# Patient Record
Sex: Male | Born: 1945 | Race: White | Hispanic: No | Smoking: Never smoker
Health system: Southern US, Community
[De-identification: ages and names within clinical notes are randomized; demographics above are authoritative.]

## PROBLEM LIST (undated history)

## (undated) DIAGNOSIS — G2581 Restless legs syndrome: Secondary | ICD-10-CM

## (undated) DIAGNOSIS — F329 Major depressive disorder, single episode, unspecified: Secondary | ICD-10-CM

## (undated) DIAGNOSIS — K589 Irritable bowel syndrome without diarrhea: Secondary | ICD-10-CM

## (undated) DIAGNOSIS — Z87442 Personal history of urinary calculi: Secondary | ICD-10-CM

## (undated) DIAGNOSIS — J329 Chronic sinusitis, unspecified: Secondary | ICD-10-CM

## (undated) DIAGNOSIS — M1990 Unspecified osteoarthritis, unspecified site: Secondary | ICD-10-CM

## (undated) DIAGNOSIS — K5790 Diverticulosis of intestine, part unspecified, without perforation or abscess without bleeding: Secondary | ICD-10-CM

## (undated) DIAGNOSIS — F419 Anxiety disorder, unspecified: Secondary | ICD-10-CM

## (undated) DIAGNOSIS — I499 Cardiac arrhythmia, unspecified: Secondary | ICD-10-CM

## (undated) DIAGNOSIS — J309 Allergic rhinitis, unspecified: Secondary | ICD-10-CM

## (undated) DIAGNOSIS — I10 Essential (primary) hypertension: Secondary | ICD-10-CM

## (undated) DIAGNOSIS — F32A Depression, unspecified: Secondary | ICD-10-CM

## (undated) DIAGNOSIS — Z950 Presence of cardiac pacemaker: Secondary | ICD-10-CM

## (undated) DIAGNOSIS — J189 Pneumonia, unspecified organism: Secondary | ICD-10-CM

## (undated) DIAGNOSIS — I2510 Atherosclerotic heart disease of native coronary artery without angina pectoris: Secondary | ICD-10-CM

## (undated) DIAGNOSIS — D649 Anemia, unspecified: Secondary | ICD-10-CM

## (undated) DIAGNOSIS — K219 Gastro-esophageal reflux disease without esophagitis: Secondary | ICD-10-CM

## (undated) HISTORY — PX: KNEE ARTHROSCOPY: SUR90

## (undated) HISTORY — PX: COLONOSCOPY: SHX174

## (undated) HISTORY — PX: OTHER SURGICAL HISTORY: SHX169

## (undated) HISTORY — PX: BACK SURGERY: SHX140

---

## 1984-12-09 DIAGNOSIS — Z87442 Personal history of urinary calculi: Secondary | ICD-10-CM

## 1984-12-09 HISTORY — DX: Personal history of urinary calculi: Z87.442

## 2005-04-05 ENCOUNTER — Ambulatory Visit: Payer: Self-pay | Admitting: Unknown Physician Specialty

## 2005-04-11 ENCOUNTER — Ambulatory Visit: Payer: Self-pay | Admitting: Unknown Physician Specialty

## 2007-06-03 ENCOUNTER — Ambulatory Visit: Payer: Self-pay | Admitting: Internal Medicine

## 2008-07-25 ENCOUNTER — Other Ambulatory Visit: Payer: Self-pay

## 2008-07-25 ENCOUNTER — Emergency Department: Payer: Self-pay | Admitting: Emergency Medicine

## 2008-08-10 ENCOUNTER — Ambulatory Visit: Payer: Self-pay | Admitting: Cardiology

## 2014-01-26 ENCOUNTER — Ambulatory Visit: Payer: Self-pay | Admitting: Physical Medicine and Rehabilitation

## 2014-11-16 ENCOUNTER — Ambulatory Visit: Payer: Self-pay | Admitting: Pain Medicine

## 2014-11-28 ENCOUNTER — Ambulatory Visit: Payer: Self-pay | Admitting: Pain Medicine

## 2014-12-27 ENCOUNTER — Ambulatory Visit: Payer: Self-pay | Admitting: Pain Medicine

## 2015-01-16 ENCOUNTER — Ambulatory Visit: Payer: Self-pay | Admitting: Pain Medicine

## 2015-04-21 ENCOUNTER — Ambulatory Visit
Admission: RE | Admit: 2015-04-21 | Discharge: 2015-04-21 | Disposition: A | Discharge status: Home / Self Care | Payer: Medicare PPO | Source: Ambulatory Visit | Attending: Unknown Physician Specialty | Admitting: Unknown Physician Specialty

## 2015-04-21 ENCOUNTER — Encounter
Admission: RE | Disposition: A | Discharge status: Home / Self Care | Payer: Self-pay | Source: Ambulatory Visit | Attending: Unknown Physician Specialty

## 2015-04-21 ENCOUNTER — Ambulatory Visit: Payer: Medicare PPO | Admitting: Anesthesiology

## 2015-04-21 DIAGNOSIS — Z791 Long term (current) use of non-steroidal anti-inflammatories (NSAID): Secondary | ICD-10-CM | POA: Insufficient documentation

## 2015-04-21 DIAGNOSIS — K219 Gastro-esophageal reflux disease without esophagitis: Secondary | ICD-10-CM | POA: Insufficient documentation

## 2015-04-21 DIAGNOSIS — F419 Anxiety disorder, unspecified: Secondary | ICD-10-CM | POA: Diagnosis not present

## 2015-04-21 DIAGNOSIS — I2510 Atherosclerotic heart disease of native coronary artery without angina pectoris: Secondary | ICD-10-CM | POA: Diagnosis not present

## 2015-04-21 DIAGNOSIS — D120 Benign neoplasm of cecum: Secondary | ICD-10-CM | POA: Diagnosis not present

## 2015-04-21 DIAGNOSIS — Z9889 Other specified postprocedural states: Secondary | ICD-10-CM | POA: Diagnosis not present

## 2015-04-21 DIAGNOSIS — Z7951 Long term (current) use of inhaled steroids: Secondary | ICD-10-CM | POA: Insufficient documentation

## 2015-04-21 DIAGNOSIS — J309 Allergic rhinitis, unspecified: Secondary | ICD-10-CM | POA: Diagnosis not present

## 2015-04-21 DIAGNOSIS — D128 Benign neoplasm of rectum: Secondary | ICD-10-CM | POA: Insufficient documentation

## 2015-04-21 DIAGNOSIS — K5730 Diverticulosis of large intestine without perforation or abscess without bleeding: Secondary | ICD-10-CM | POA: Diagnosis not present

## 2015-04-21 DIAGNOSIS — I10 Essential (primary) hypertension: Secondary | ICD-10-CM | POA: Diagnosis not present

## 2015-04-21 DIAGNOSIS — F329 Major depressive disorder, single episode, unspecified: Secondary | ICD-10-CM | POA: Insufficient documentation

## 2015-04-21 DIAGNOSIS — D122 Benign neoplasm of ascending colon: Secondary | ICD-10-CM | POA: Diagnosis not present

## 2015-04-21 DIAGNOSIS — K640 First degree hemorrhoids: Secondary | ICD-10-CM | POA: Insufficient documentation

## 2015-04-21 DIAGNOSIS — Z1211 Encounter for screening for malignant neoplasm of colon: Secondary | ICD-10-CM | POA: Diagnosis not present

## 2015-04-21 DIAGNOSIS — Z7982 Long term (current) use of aspirin: Secondary | ICD-10-CM | POA: Diagnosis not present

## 2015-04-21 DIAGNOSIS — M1990 Unspecified osteoarthritis, unspecified site: Secondary | ICD-10-CM | POA: Insufficient documentation

## 2015-04-21 DIAGNOSIS — Z79899 Other long term (current) drug therapy: Secondary | ICD-10-CM | POA: Diagnosis not present

## 2015-04-21 HISTORY — DX: Restless legs syndrome: G25.81

## 2015-04-21 HISTORY — DX: Unspecified osteoarthritis, unspecified site: M19.90

## 2015-04-21 HISTORY — DX: Hypercalcemia: E83.52

## 2015-04-21 HISTORY — DX: Major depressive disorder, single episode, unspecified: F32.9

## 2015-04-21 HISTORY — DX: Gastro-esophageal reflux disease without esophagitis: K21.9

## 2015-04-21 HISTORY — PX: COLONOSCOPY: SHX5424

## 2015-04-21 HISTORY — DX: Allergic rhinitis, unspecified: J30.9

## 2015-04-21 HISTORY — DX: Depression, unspecified: F32.A

## 2015-04-21 HISTORY — DX: Anxiety disorder, unspecified: F41.9

## 2015-04-21 HISTORY — DX: Anemia, unspecified: D64.9

## 2015-04-21 HISTORY — DX: Atherosclerotic heart disease of native coronary artery without angina pectoris: I25.10

## 2015-04-21 HISTORY — DX: Chronic sinusitis, unspecified: J32.9

## 2015-04-21 HISTORY — DX: Essential (primary) hypertension: I10

## 2015-04-21 SURGERY — COLONOSCOPY
Anesthesia: General

## 2015-04-21 MED ORDER — EPHEDRINE SULFATE 50 MG/ML IJ SOLN
INTRAMUSCULAR | Status: DC | PRN
  Administered 2015-04-21: 10 mg via INTRAVENOUS

## 2015-04-21 MED ORDER — GLYCOPYRROLATE 0.2 MG/ML IJ SOLN
INTRAMUSCULAR | Status: DC | PRN
  Administered 2015-04-21: 0.2 mg via INTRAVENOUS

## 2015-04-21 MED ORDER — PROPOFOL INFUSION 10 MG/ML OPTIME
INTRAVENOUS | Status: DC | PRN
  Administered 2015-04-21: 140 ug/kg/min via INTRAVENOUS

## 2015-04-21 MED ORDER — FENTANYL CITRATE (PF) 100 MCG/2ML IJ SOLN: 25.0000 ug | INTRAMUSCULAR | Status: DC | PRN

## 2015-04-21 MED ORDER — SODIUM CHLORIDE 0.9 % IV SOLN
INTRAVENOUS | Status: DC
  Administered 2015-04-21: 1000 mL via INTRAVENOUS

## 2015-04-21 MED ORDER — MIDAZOLAM HCL 2 MG/2ML IJ SOLN
INTRAMUSCULAR | Status: DC | PRN
  Administered 2015-04-21: 1 mg via INTRAVENOUS

## 2015-04-21 MED ORDER — PROPOFOL 10 MG/ML IV BOLUS
INTRAVENOUS | Status: DC | PRN
  Administered 2015-04-21: 30 mg via INTRAVENOUS
  Administered 2015-04-21: 40 mg via INTRAVENOUS

## 2015-04-21 MED ORDER — GLYCOPYRROLATE 0.2 MG/ML IJ SOLN: INTRAMUSCULAR | Status: DC | PRN

## 2015-04-21 MED ORDER — ONDANSETRON HCL 4 MG/2ML IJ SOLN: 4.0000 mg | Freq: Once | INTRAMUSCULAR | Status: DC | PRN

## 2015-04-21 MED ORDER — FENTANYL CITRATE (PF) 100 MCG/2ML IJ SOLN
INTRAMUSCULAR | Status: DC | PRN
  Administered 2015-04-21: 50 ug via INTRAVENOUS

## 2015-04-21 MED ORDER — SODIUM CHLORIDE 0.9 % IV SOLN: INTRAVENOUS | Status: DC

## 2015-04-21 NOTE — Anesthesia Postprocedure Evaluation (Signed)
  Anesthesia Post-op Note  Patient: Jesus Blevins  Procedure(s) Performed: Procedure(s): COLONOSCOPY (N/A)  Anesthesia type:General  Patient location: PACU  Post pain: Pain level controlled  Post assessment: Post-op Vital signs reviewed, Patient's Cardiovascular Status Stable, Respiratory Function Stable, Patent Airway and No signs of Nausea or vomiting  Post vital signs: Reviewed and stable  Last Vitals:  Filed Vitals:   04/21/15 0900  BP: 97/72  Pulse: 71  Temp: 36 C  Resp: 19    Level of consciousness: awake, alert  and patient cooperative  Complications: No apparent anesthesia complications

## 2015-04-21 NOTE — Transfer of Care (Signed)
Immediate Anesthesia Transfer of Care Note  Patient: Jesus Blevins  Procedure(s) Performed: Procedure(s): COLONOSCOPY (N/A)  Patient Location: PACU  Anesthesia Type:MAC  Level of Consciousness: awake, alert  and patient cooperative  Airway & Oxygen Therapy: Patient Spontanous Breathing and Patient connected to nasal cannula oxygen  Post-op Assessment: Report given to RN  Post vital signs: Reviewed and stable  Last Vitals:  Filed Vitals:   04/21/15 0724  BP: 141/86  Pulse: 52  Temp: 35.9 C  Resp: 17    Complications: No apparent anesthesia complications

## 2015-04-21 NOTE — Op Note (Signed)
[facility] Gastroenterology Patient Name: Jesus Blevins Procedure Date: 04/21/2015 8:01 AM MRN: [id number] Account #: [id number] Date of Birth: 12-11-1945 Admit Type: Outpatient Age: 69 Room: [facility] ENDO ROOM 1 Gender: Male Note Status: Finalized Procedure:         Colonoscopy Indications:       Screening for colorectal malignant neoplasm Providers:         Manya Silvas, MD Referring MD:      Ramonita Lab, MD (Referring MD) Medicines:         Propofol per Anesthesia Complications:     No immediate complications. Procedure:         Pre-Anesthesia Assessment:                    - After reviewing the risks and benefits, the patient was                     deemed in satisfactory condition to undergo the procedure.                    After obtaining informed consent, the colonoscope was                     passed under direct vision. Throughout the procedure, the                     patient's blood pressure, pulse, and oxygen saturations                     were monitored continuously. The Olympus PCF-H180AL                     colonoscope ( S#: [id number] ) was introduced through the                     anus and advanced to the the cecum, identified by                     appendiceal orifice and ileocecal valve. The colonoscopy                     was performed without difficulty. The patient tolerated                     the procedure well. The quality of the bowel preparation                     was excellent. Findings:      A 6 mm polyp was found in the rectum. The polyp was sessile. The polyp       was removed with a hot snare. Resection and retrieval were complete.      Three sessile polyps were found in the ascending colon. The polyps were       diminutive in size. These polyps were removed with a jumbo cold forceps.       Resection and retrieval were complete.      Many small-mouthed diverticula were found in the sigmoid colon.      Internal hemorrhoids were  found during endoscopy. The hemorrhoids were       small and Grade I (internal hemorrhoids that do not prolapse). Impression:        - One 6 mm polyp in the rectum. Resected and retrieved.                    -  Three diminutive polyps in the ascending colon. Resected                     and retrieved.                    - Diverticulosis in the sigmoid colon.                    - Internal hemorrhoids. Recommendation:    - Await pathology results. Manya Silvas, MD 04/21/2015 8:44:43 AM This report has been signed electronically. Number of Addenda: 0 Note Initiated On: 04/21/2015 8:01 AM Scope Withdrawal Time: 0 hours 13 minutes 12 seconds  Total Procedure Duration: 0 hours 28 minutes 6 seconds       [facility]

## 2015-04-21 NOTE — H&P (Signed)
Primary Care Physician:  Tama High III Primary Gastroenterologist:  Dr. Vira Agar  Pre-Procedure History & Physical: HPI:  Jesus Blevins is a 69 y.o. male is here for a screening colonoscopy.   Past Medical History  Diagnosis Date  . Hypertension   . GERD (gastroesophageal reflux disease)   . Allergic rhinitis   . Restless leg   . C. difficile colitis   . Hypercalcemia   . Sinusitis   . Depression   . Anxiety   . Osteoarthritis   . Anemia   . Coronary artery disease     Past Surgical History  Procedure Laterality Date  . Sinus septum and polyp surgery    . Thumb trigger release Left   . Tongue polyp excision    . Knee arthoscopy    . Spine surgery l3-l5    . Colonoscopy    . Lumb disectomy cervicle/lumbar multiple levels      Prior to Admission medications   Medication Sig Start Date End Date Taking? Authorizing Provider  ALPRAZolam Duanne Moron) 0.5 MG tablet Take 0.5 mg by mouth 2 (two) times daily as needed for anxiety.   Yes Historical Provider, MD  aspirin 81 MG tablet Take 81 mg by mouth daily.   Yes Historical Provider, MD  atorvastatin (LIPITOR) 40 MG tablet Take 40 mg by mouth daily.   Yes Historical Provider, MD  azelastine (ASTELIN) 0.1 % nasal spray Place 1 spray into both nostrils 2 (two) times daily. Use in each nostril as directed   Yes Historical Provider, MD  calcium-vitamin D (OSCAL WITH D) 500-200 MG-UNIT per tablet Take 1 tablet by mouth once.   Yes Historical Provider, MD  fexofenadine (ALLEGRA) 180 MG tablet Take 180 mg by mouth daily.   Yes Historical Provider, MD  fluticasone (FLONASE) 50 MCG/ACT nasal spray Place 1 spray into both nostrils daily.   Yes Historical Provider, MD  glucosamine-chondroitin 500-400 MG tablet Take 2 tablets by mouth once.   Yes Historical Provider, MD  losartan (COZAAR) 50 MG tablet Take 50 mg by mouth daily.   Yes Historical Provider, MD  montelukast (SINGULAIR) 10 MG tablet Take 10 mg by mouth at bedtime.   Yes  Historical Provider, MD  MULTI GINSENG 1000 MG CAPS Take 1,000 mg by mouth once.   Yes Historical Provider, MD  Multiple Vitamin (MULTIVITAMIN) capsule Take 1 capsule by mouth daily.   Yes Historical Provider, MD  naproxen sodium (ANAPROX) 220 MG tablet Take 440 mg by mouth 2 (two) times daily with a meal.   Yes Historical Provider, MD  omega-3 acid ethyl esters (LOVAZA) 1 G capsule Take 2 capsules by mouth once.   Yes Historical Provider, MD  omeprazole (PRILOSEC) 40 MG capsule Take 20 mg by mouth daily.   Yes Historical Provider, MD  polyethylene glycol (MIRALAX / GLYCOLAX) packet Take 17 g by mouth daily.   Yes Historical Provider, MD  senna (SENOKOT) 8.6 MG tablet Take 1 tablet by mouth 2 (two) times daily.   Yes Historical Provider, MD  sertraline (ZOLOFT) 50 MG tablet Take 50 mg by mouth daily.   Yes Historical Provider, MD    Allergies as of 03/31/2015  . (Not on File)     Review of Systems: See HPI, otherwise negative ROS  Physical Exam: BP 141/86 mmHg  Pulse 52  Temp(Src) 96.7 F (35.9 C) (Tympanic)  Resp 17  Ht 5\' 11"  (1.803 m)  Wt 95.255 kg (210 lb)  BMI 29.30 kg/m2  SpO2 99% General:   Alert,  pleasant and cooperative in NAD Head:  Normocephalic and atraumatic. Neck:  Supple; no masses or thyromegaly. Lungs:  Clear throughout to auscultation.    Heart:  Regular rate and rhythm. Abdomen:  Soft, nontender and nondistended. Normal bowel sounds, without guarding, and without rebound.   Neurologic:  Alert and  oriented x4;  grossly normal neurologically.  Impression/Plan: Jesus Blevins is now here to undergo a screening colonoscopy.  Risks, benefits, and alternatives regarding colonoscopy have been reviewed with the patient.  Questions have been answered.  All parties agreeable.   Gaylyn Cheers , MD

## 2015-04-21 NOTE — Anesthesia Procedure Notes (Signed)
Performed by: Allean Found Pre-anesthesia Checklist: Patient identified, Emergency Drugs available, Suction available and Patient being monitored Oxygen Delivery Method: Nasal cannula Intubation Type: IV induction Placement Confirmation: positive ETCO2

## 2015-04-21 NOTE — Anesthesia Preprocedure Evaluation (Signed)
Anesthesia Evaluation  Patient identified by MRN, date of birth, ID band Patient awake    Reviewed: Allergy & Precautions, NPO status , Patient's Chart, lab work & pertinent test results, reviewed documented beta blocker date and time   Airway Mallampati: II  TM Distance: >3 FB Neck ROM: Full    Dental  (+) Chipped, Caps   Pulmonary neg pulmonary ROS,  breath sounds clear to auscultation  Pulmonary exam normal       Cardiovascular hypertension, + CAD Rhythm:Regular Rate:Normal     Neuro/Psych Anxiety Depression negative neurological ROS     GI/Hepatic Neg liver ROS, GERD-  ,  Endo/Other  negative endocrine ROS  Renal/GU negative Renal ROS  negative genitourinary   Musculoskeletal  (+) Arthritis -, Osteoarthritis,    Abdominal Normal abdominal exam  (+)   Peds negative pediatric ROS (+)  Hematology negative hematology ROS (+)   Anesthesia Other Findings   Reproductive/Obstetrics                             Anesthesia Physical Anesthesia Plan  ASA: III  Anesthesia Plan: General   Post-op Pain Management:    Induction: Intravenous  Airway Management Planned: Nasal Cannula  Additional Equipment:   Intra-op Plan:   Post-operative Plan:   Informed Consent: I have reviewed the patients History and Physical, chart, labs and discussed the procedure including the risks, benefits and alternatives for the proposed anesthesia with the patient or authorized representative who has indicated his/her understanding and acceptance.   Dental advisory given  Plan Discussed with: CRNA and Surgeon  Anesthesia Plan Comments:         Anesthesia Quick Evaluation

## 2015-04-24 ENCOUNTER — Encounter: Payer: Self-pay | Admitting: Unknown Physician Specialty

## 2015-04-24 LAB — SURGICAL PATHOLOGY

## 2015-12-10 HISTORY — PX: JOINT REPLACEMENT: SHX530

## 2016-03-26 ENCOUNTER — Other Ambulatory Visit: Payer: Self-pay | Admitting: Orthopedic Surgery

## 2016-03-26 ENCOUNTER — Encounter
Admission: RE | Admit: 2016-03-26 | Discharge: 2016-03-26 | Disposition: A | Discharge status: Home / Self Care | Payer: Medicare Other | Source: Ambulatory Visit | Attending: Orthopedic Surgery | Admitting: Orthopedic Surgery

## 2016-03-26 DIAGNOSIS — Z01810 Encounter for preprocedural cardiovascular examination: Secondary | ICD-10-CM | POA: Insufficient documentation

## 2016-03-26 LAB — URINALYSIS COMPLETE WITH MICROSCOPIC (ARMC ONLY)
Bacteria, UA: NONE SEEN
Bilirubin Urine: NEGATIVE
GLUCOSE, UA: NEGATIVE mg/dL
HGB URINE DIPSTICK: NEGATIVE
KETONES UR: NEGATIVE mg/dL
Leukocytes, UA: NEGATIVE
NITRITE: NEGATIVE
PROTEIN: NEGATIVE mg/dL
RBC / HPF: NONE SEEN RBC/hpf (ref 0–5)
SPECIFIC GRAVITY, URINE: 1.015 (ref 1.005–1.030)
Squamous Epithelial / LPF: NONE SEEN
pH: 7 (ref 5.0–8.0)

## 2016-03-26 LAB — BASIC METABOLIC PANEL
ANION GAP: 7 (ref 5–15)
BUN: 14 mg/dL (ref 6–20)
CO2: 26 mmol/L (ref 22–32)
Calcium: 9.5 mg/dL (ref 8.9–10.3)
Chloride: 105 mmol/L (ref 101–111)
Creatinine, Ser: 0.85 mg/dL (ref 0.61–1.24)
GFR calc Af Amer: >60 mL/min (ref >60)
GFR calc non Af Amer: >60 mL/min (ref >60)
Glucose, Bld: 94 mg/dL (ref 65–99)
POTASSIUM: 4.2 mmol/L (ref 3.5–5.1)
SODIUM: 138 mmol/L (ref 135–145)

## 2016-03-26 LAB — CBC WITH DIFFERENTIAL/PLATELET
BASOS ABS: 0 10*3/uL (ref 0–0.1)
Basophils Relative: 0 %
Eosinophils Absolute: 0.2 10*3/uL (ref 0–0.7)
Eosinophils Relative: 2 %
HEMATOCRIT: 43.1 % (ref 40.0–52.0)
HEMOGLOBIN: 14.7 g/dL (ref 13.0–18.0)
LYMPHS ABS: 3.5 10*3/uL (ref 1.0–3.6)
LYMPHS PCT: 36 %
MCH: 30.6 pg (ref 26.0–34.0)
MCHC: 34.2 g/dL (ref 32.0–36.0)
MCV: 89.4 fL (ref 80.0–100.0)
Monocytes Absolute: 0.7 10*3/uL (ref 0.2–1.0)
Monocytes Relative: 8 %
NEUTROS ABS: 5.4 10*3/uL (ref 1.4–6.5)
Neutrophils Relative %: 54 %
Platelets: 201 10*3/uL (ref 150–440)
RBC: 4.82 MIL/uL (ref 4.40–5.90)
RDW: 13.2 % (ref 11.5–14.5)
WBC: 9.8 10*3/uL (ref 3.8–10.6)

## 2016-03-26 LAB — TYPE AND SCREEN
ABO/RH(D): O POS
Antibody Screen: NEGATIVE

## 2016-03-26 LAB — PROTIME-INR
INR: 1.01
Prothrombin Time: 13.5 seconds (ref 11.4–15.0)

## 2016-03-26 LAB — APTT: aPTT: 31 seconds (ref 24–36)

## 2016-03-26 LAB — SURGICAL PCR SCREEN
MRSA, PCR: NEGATIVE
STAPHYLOCOCCUS AUREUS: NEGATIVE

## 2016-03-26 NOTE — Patient Instructions (Addendum)
Your procedure is scheduled on: Tuesday 04/02/16 Report to Day Surgery. 2ND FLOOR MEDICAL MALL ENTRANCE To find out your arrival time please call [phone] between 1PM - 3PM on Monday 04/01/16.  Remember: Instructions that are not followed completely may result in serious medical risk, up to and including death, or upon the discretion of your surgeon and anesthesiologist your surgery may need to be rescheduled.    __X__ 1. Do not eat food or drink liquids after midnight. No gum chewing or hard candies.     __X__ 2. No Alcohol for 24 hours before or after surgery.   ____ 3. Bring all medications with you on the day of surgery if instructed.    __X__ 4. Notify your doctor if there is any change in your medical condition     (cold, fever, infections).     Do not wear jewelry, make-up, hairpins, clips or nail polish.  Do not wear lotions, powders, or perfumes.   Do not shave 48 hours prior to surgery. Men may shave face and neck.  Do not bring valuables to the hospital.    [facility] is not responsible for any belongings or valuables.               Contacts, dentures or bridgework may not be worn into surgery.  Leave your suitcase in the car. After surgery it may be brought to your room.  For patients admitted to the hospital, discharge time is determined by your                treatment team.   Patients discharged the day of surgery will not be allowed to drive home.   Please read over the following fact sheets that you were given:   MRSA Information and Surgical Site Infection Prevention   __X__ Take these medicines the morning of surgery with A SIP OF WATER:    1. FEXOFENADINE  2. LOSARTAN  3. OMEPRAZOLE  4.   5.  6.  ____ Fleet Enema (as directed)   __X__ Use CHG Soap as directed  ____ Use inhalers on the day of surgery  ____ Stop metformin 2 days prior to surgery    ____ Take 1/2 of usual insulin dose the night before surgery and none on the morning of surgery.    __X__ Stop Coumadin/Plavix/aspirin on ALREADY STOPPED 4/15  __X__ Stop Anti-inflammatories on ALREADY STOPPED 4/15 (NAPROXEN)   __X__ Stop supplements until after surgery. (GLUCOSAMINE, GINSENG, FISH OIL) ALREADY STOPPED 4/15   ____ Bring C-Pap to the hospital.

## 2016-03-27 LAB — ABO/RH: ABO/RH(D): O POS

## 2016-03-28 NOTE — Pre-Procedure Instructions (Signed)
Pre-op Clearance on chart

## 2016-04-02 ENCOUNTER — Encounter: Admission: RE | Payer: Self-pay | Source: Ambulatory Visit

## 2016-04-02 ENCOUNTER — Inpatient Hospital Stay: Admission: RE | Admit: 2016-04-02 | Payer: Medicare Other | Source: Ambulatory Visit | Admitting: Orthopedic Surgery

## 2016-04-02 SURGERY — ARTHROPLASTY, KNEE, TOTAL
Anesthesia: Choice | Laterality: Left

## 2016-04-16 ENCOUNTER — Inpatient Hospital Stay: Payer: Medicare Other | Admitting: Anesthesiology

## 2016-04-16 ENCOUNTER — Inpatient Hospital Stay
Admission: RE | Admit: 2016-04-16 | Discharge: 2016-04-18 | DRG: 470 | Disposition: A | Discharge status: Home / Self Care | Payer: Medicare Other | Source: Ambulatory Visit | Attending: Orthopedic Surgery | Admitting: Orthopedic Surgery

## 2016-04-16 ENCOUNTER — Encounter
Admission: RE | Disposition: A | Discharge status: Home / Self Care | Payer: Self-pay | Source: Ambulatory Visit | Attending: Orthopedic Surgery

## 2016-04-16 ENCOUNTER — Inpatient Hospital Stay: Payer: Medicare Other

## 2016-04-16 DIAGNOSIS — Z882 Allergy status to sulfonamides status: Secondary | ICD-10-CM

## 2016-04-16 DIAGNOSIS — Z7951 Long term (current) use of inhaled steroids: Secondary | ICD-10-CM

## 2016-04-16 DIAGNOSIS — Z833 Family history of diabetes mellitus: Secondary | ICD-10-CM | POA: Diagnosis not present

## 2016-04-16 DIAGNOSIS — Z96652 Presence of left artificial knee joint: Secondary | ICD-10-CM

## 2016-04-16 DIAGNOSIS — Z79899 Other long term (current) drug therapy: Secondary | ICD-10-CM

## 2016-04-16 DIAGNOSIS — I2510 Atherosclerotic heart disease of native coronary artery without angina pectoris: Secondary | ICD-10-CM | POA: Diagnosis present

## 2016-04-16 DIAGNOSIS — Z823 Family history of stroke: Secondary | ICD-10-CM | POA: Diagnosis not present

## 2016-04-16 DIAGNOSIS — Z9889 Other specified postprocedural states: Secondary | ICD-10-CM

## 2016-04-16 DIAGNOSIS — I10 Essential (primary) hypertension: Secondary | ICD-10-CM | POA: Diagnosis present

## 2016-04-16 DIAGNOSIS — Z8249 Family history of ischemic heart disease and other diseases of the circulatory system: Secondary | ICD-10-CM | POA: Diagnosis not present

## 2016-04-16 DIAGNOSIS — I959 Hypotension, unspecified: Secondary | ICD-10-CM | POA: Diagnosis present

## 2016-04-16 DIAGNOSIS — K219 Gastro-esophageal reflux disease without esophagitis: Secondary | ICD-10-CM | POA: Diagnosis present

## 2016-04-16 DIAGNOSIS — Z809 Family history of malignant neoplasm, unspecified: Secondary | ICD-10-CM

## 2016-04-16 DIAGNOSIS — Z8261 Family history of arthritis: Secondary | ICD-10-CM | POA: Diagnosis not present

## 2016-04-16 DIAGNOSIS — M1712 Unilateral primary osteoarthritis, left knee: Secondary | ICD-10-CM | POA: Diagnosis present

## 2016-04-16 DIAGNOSIS — R0981 Nasal congestion: Secondary | ICD-10-CM | POA: Diagnosis present

## 2016-04-16 HISTORY — PX: TOTAL KNEE ARTHROPLASTY: SHX125

## 2016-04-16 LAB — CBC
HCT: 38.8 % — ABNORMAL LOW (ref 40.0–52.0)
HEMOGLOBIN: 13.2 g/dL (ref 13.0–18.0)
MCH: 30.2 pg (ref 26.0–34.0)
MCHC: 34 g/dL (ref 32.0–36.0)
MCV: 88.8 fL (ref 80.0–100.0)
Platelets: 172 10*3/uL (ref 150–440)
RBC: 4.37 MIL/uL — ABNORMAL LOW (ref 4.40–5.90)
RDW: 12.7 % (ref 11.5–14.5)
WBC: 9.1 10*3/uL (ref 3.8–10.6)

## 2016-04-16 LAB — CREATININE, SERUM
CREATININE: 0.78 mg/dL (ref 0.61–1.24)
GFR calc Af Amer: >60 mL/min (ref >60)

## 2016-04-16 LAB — TYPE AND SCREEN
ABO/RH(D): O POS
Antibody Screen: NEGATIVE

## 2016-04-16 SURGERY — ARTHROPLASTY, KNEE, TOTAL
Anesthesia: Spinal | Laterality: Left | Wound class: Clean

## 2016-04-16 MED ORDER — PANTOPRAZOLE SODIUM 40 MG PO TBEC
40.0000 mg | DELAYED_RELEASE_TABLET | Freq: Every day | ORAL | Status: DC
  Administered 2016-04-17 – 2016-04-18 (×2): 40 mg via ORAL
  Filled 2016-04-16 (×4): qty 1

## 2016-04-16 MED ORDER — TRANEXAMIC ACID 1000 MG/10ML IV SOLN
1000.0000 mg | Freq: Once | INTRAVENOUS | Status: AC
  Administered 2016-04-16: 1000 mg via INTRAVENOUS
  Filled 2016-04-16: qty 10

## 2016-04-16 MED ORDER — ASPIRIN 81 MG PO TABS: 81.0000 mg | ORAL_TABLET | Freq: Every day | ORAL | Status: DC

## 2016-04-16 MED ORDER — CEFAZOLIN SODIUM-DEXTROSE 2-4 GM/100ML-% IV SOLN
INTRAVENOUS | Status: AC
  Filled 2016-04-16: qty 100

## 2016-04-16 MED ORDER — FLUTICASONE PROPIONATE 50 MCG/ACT NA SUSP
1.0000 | Freq: Every day | NASAL | Status: DC
  Administered 2016-04-17 – 2016-04-18 (×2): 1 via NASAL
  Filled 2016-04-16: qty 16

## 2016-04-16 MED ORDER — ALPRAZOLAM 0.5 MG PO TABS
0.5000 mg | ORAL_TABLET | Freq: Two times a day (BID) | ORAL | Status: DC | PRN
  Filled 2016-04-16: qty 1

## 2016-04-16 MED ORDER — METOCLOPRAMIDE HCL 5 MG/ML IJ SOLN: 5.0000 mg | Freq: Three times a day (TID) | INTRAMUSCULAR | Status: DC | PRN

## 2016-04-16 MED ORDER — ACETAMINOPHEN 325 MG PO TABS: 650.0000 mg | ORAL_TABLET | Freq: Four times a day (QID) | ORAL | Status: DC | PRN

## 2016-04-16 MED ORDER — METHOCARBAMOL 500 MG PO TABS
500.0000 mg | ORAL_TABLET | Freq: Four times a day (QID) | ORAL | Status: DC | PRN
  Administered 2016-04-16: 500 mg via ORAL
  Filled 2016-04-16: qty 1

## 2016-04-16 MED ORDER — KETOROLAC TROMETHAMINE 15 MG/ML IJ SOLN
15.0000 mg | Freq: Four times a day (QID) | INTRAMUSCULAR | Status: AC | PRN
  Administered 2016-04-16 – 2016-04-17 (×2): 15 mg via INTRAVENOUS
  Filled 2016-04-16 (×2): qty 1

## 2016-04-16 MED ORDER — PROPOFOL 500 MG/50ML IV EMUL
INTRAVENOUS | Status: DC | PRN
  Administered 2016-04-16: 50 ug/kg/min via INTRAVENOUS

## 2016-04-16 MED ORDER — BUPIVACAINE HCL (PF) 0.25 % IJ SOLN
INTRAMUSCULAR | Status: AC
  Filled 2016-04-16: qty 30

## 2016-04-16 MED ORDER — ENOXAPARIN SODIUM 30 MG/0.3ML ~~LOC~~ SOLN
30.0000 mg | Freq: Two times a day (BID) | SUBCUTANEOUS | Status: DC
  Administered 2016-04-17 – 2016-04-18 (×3): 30 mg via SUBCUTANEOUS
  Filled 2016-04-16 (×3): qty 0.3

## 2016-04-16 MED ORDER — BISACODYL 10 MG RE SUPP: 10.0000 mg | Freq: Every day | RECTAL | Status: DC | PRN

## 2016-04-16 MED ORDER — POLYETHYLENE GLYCOL 3350 17 G PO PACK: 17.0000 g | PACK | Freq: Every day | ORAL | Status: DC | PRN

## 2016-04-16 MED ORDER — SODIUM CHLORIDE 0.9 % IV SOLN
Freq: Once | INTRAVENOUS | Status: AC
  Administered 2016-04-16: 15:00:00 via INTRAVENOUS

## 2016-04-16 MED ORDER — CALCIUM CARBONATE-VITAMIN D 500-200 MG-UNIT PO TABS
1.0000 | ORAL_TABLET | Freq: Once | ORAL | Status: DC
  Filled 2016-04-16: qty 1

## 2016-04-16 MED ORDER — GLYCOPYRROLATE 0.2 MG/ML IJ SOLN
INTRAMUSCULAR | Status: DC | PRN
  Administered 2016-04-16: 0.2 mg via INTRAVENOUS

## 2016-04-16 MED ORDER — ASPIRIN EC 81 MG PO TBEC
81.0000 mg | DELAYED_RELEASE_TABLET | Freq: Every day | ORAL | Status: DC
  Administered 2016-04-17 – 2016-04-18 (×2): 81 mg via ORAL
  Filled 2016-04-16 (×4): qty 1

## 2016-04-16 MED ORDER — CHLORHEXIDINE GLUCONATE 4 % EX LIQD: 1.0000 "application " | Freq: Once | CUTANEOUS | Status: DC

## 2016-04-16 MED ORDER — BUPIVACAINE LIPOSOME 1.3 % IJ SUSP
INTRAMUSCULAR | Status: AC
  Filled 2016-04-16: qty 20

## 2016-04-16 MED ORDER — CEFAZOLIN SODIUM-DEXTROSE 2-4 GM/100ML-% IV SOLN
2.0000 g | INTRAVENOUS | Status: AC
  Administered 2016-04-16: 2 g via INTRAVENOUS

## 2016-04-16 MED ORDER — MENTHOL 3 MG MT LOZG
1.0000 | LOZENGE | OROMUCOSAL | Status: DC | PRN
  Filled 2016-04-16: qty 9

## 2016-04-16 MED ORDER — SODIUM CHLORIDE 0.9 % IV BOLUS (SEPSIS)
500.0000 mL | Freq: Once | INTRAVENOUS | Status: AC
  Administered 2016-04-16: 500 mL via INTRAVENOUS

## 2016-04-16 MED ORDER — ONDANSETRON HCL 4 MG/2ML IJ SOLN: 4.0000 mg | Freq: Four times a day (QID) | INTRAMUSCULAR | Status: DC | PRN

## 2016-04-16 MED ORDER — KETOROLAC TROMETHAMINE 15 MG/ML IJ SOLN: 15.0000 mg | INTRAMUSCULAR | Status: DC

## 2016-04-16 MED ORDER — KETAMINE HCL 10 MG/ML IJ SOLN
INTRAMUSCULAR | Status: DC | PRN
  Administered 2016-04-16: 50 mg via INTRAVENOUS

## 2016-04-16 MED ORDER — LACTATED RINGERS IV SOLN
INTRAVENOUS | Status: DC
  Administered 2016-04-16 (×2): via INTRAVENOUS

## 2016-04-16 MED ORDER — FENTANYL CITRATE (PF) 100 MCG/2ML IJ SOLN
INTRAMUSCULAR | Status: DC | PRN
  Administered 2016-04-16 (×2): 50 ug via INTRAVENOUS

## 2016-04-16 MED ORDER — BUPIVACAINE HCL 0.25 % IJ SOLN
INTRAMUSCULAR | Status: DC | PRN
  Administered 2016-04-16: 30 mL

## 2016-04-16 MED ORDER — LIDOCAINE HCL (PF) 2 % IJ SOLN
INTRAMUSCULAR | Status: DC | PRN
  Administered 2016-04-16: 50 mg

## 2016-04-16 MED ORDER — ZOLPIDEM TARTRATE 5 MG PO TABS: 5.0000 mg | ORAL_TABLET | Freq: Every evening | ORAL | Status: DC | PRN

## 2016-04-16 MED ORDER — METHOCARBAMOL 1000 MG/10ML IJ SOLN
500.0000 mg | Freq: Four times a day (QID) | INTRAVENOUS | Status: DC | PRN
  Filled 2016-04-16: qty 5

## 2016-04-16 MED ORDER — DOCUSATE SODIUM 100 MG PO CAPS
100.0000 mg | ORAL_CAPSULE | Freq: Two times a day (BID) | ORAL | Status: DC
  Administered 2016-04-16 – 2016-04-18 (×4): 100 mg via ORAL
  Filled 2016-04-16 (×5): qty 1

## 2016-04-16 MED ORDER — ACETAMINOPHEN 650 MG RE SUPP: 650.0000 mg | Freq: Four times a day (QID) | RECTAL | Status: DC | PRN

## 2016-04-16 MED ORDER — OXYCODONE HCL 5 MG PO TABS
10.0000 mg | ORAL_TABLET | ORAL | Status: DC | PRN
  Administered 2016-04-16 – 2016-04-18 (×10): 10 mg via ORAL
  Filled 2016-04-16 (×9): qty 2
  Filled 2016-04-16: qty 1
  Filled 2016-04-16: qty 2

## 2016-04-16 MED ORDER — MAGNESIUM HYDROXIDE 400 MG/5ML PO SUSP
30.0000 mL | Freq: Every day | ORAL | Status: DC | PRN
  Administered 2016-04-17: 30 mL via ORAL
  Filled 2016-04-16: qty 30

## 2016-04-16 MED ORDER — NEOMYCIN-POLYMYXIN B GU 40-200000 IR SOLN
Status: AC
Start: 2016-04-16 — End: 2016-04-16
  Filled 2016-04-16: qty 20

## 2016-04-16 MED ORDER — AZELASTINE HCL 0.1 % NA SOLN
1.0000 | Freq: Two times a day (BID) | NASAL | Status: DC
  Administered 2016-04-16 – 2016-04-17 (×2): 1 via NASAL
  Filled 2016-04-16: qty 30

## 2016-04-16 MED ORDER — BUPIVACAINE HCL (PF) 0.5 % IJ SOLN
INTRAMUSCULAR | Status: DC | PRN
  Administered 2016-04-16: 3 mL via INTRATHECAL

## 2016-04-16 MED ORDER — ATORVASTATIN CALCIUM 20 MG PO TABS
40.0000 mg | ORAL_TABLET | Freq: Every day | ORAL | Status: DC
  Administered 2016-04-16 – 2016-04-17 (×2): 40 mg via ORAL
  Filled 2016-04-16 (×2): qty 2

## 2016-04-16 MED ORDER — HYDROMORPHONE HCL 1 MG/ML IJ SOLN
1.0000 mg | INTRAMUSCULAR | Status: DC | PRN
  Administered 2016-04-16 (×2): 1 mg via INTRAVENOUS
  Filled 2016-04-16 (×3): qty 1

## 2016-04-16 MED ORDER — MULTIVITAMINS PO CAPS
1.0000 | ORAL_CAPSULE | Freq: Every day | ORAL | Status: DC
Start: 2016-04-16 — End: 2016-04-16

## 2016-04-16 MED ORDER — CEFAZOLIN SODIUM-DEXTROSE 2-4 GM/100ML-% IV SOLN
2.0000 g | Freq: Four times a day (QID) | INTRAVENOUS | Status: AC
  Administered 2016-04-16 (×2): 2 g via INTRAVENOUS
  Filled 2016-04-16 (×2): qty 100

## 2016-04-16 MED ORDER — SODIUM CHLORIDE 0.9 % IJ SOLN
INTRAMUSCULAR | Status: AC
  Filled 2016-04-16: qty 100

## 2016-04-16 MED ORDER — SERTRALINE HCL 50 MG PO TABS
50.0000 mg | ORAL_TABLET | Freq: Every day | ORAL | Status: DC
  Administered 2016-04-16 – 2016-04-17 (×2): 50 mg via ORAL
  Filled 2016-04-16 (×2): qty 1

## 2016-04-16 MED ORDER — SODIUM CHLORIDE 0.9 % IV SOLN
INTRAVENOUS | Status: DC | PRN
  Administered 2016-04-16: 20 mL

## 2016-04-16 MED ORDER — ONDANSETRON HCL 4 MG PO TABS
4.0000 mg | ORAL_TABLET | Freq: Four times a day (QID) | ORAL | Status: DC | PRN
  Administered 2016-04-16: 4 mg via ORAL
  Filled 2016-04-16: qty 1

## 2016-04-16 MED ORDER — FENTANYL CITRATE (PF) 100 MCG/2ML IJ SOLN
INTRAMUSCULAR | Status: AC
  Filled 2016-04-16: qty 2

## 2016-04-16 MED ORDER — ONDANSETRON HCL 4 MG/2ML IJ SOLN: 4.0000 mg | Freq: Once | INTRAMUSCULAR | Status: DC | PRN

## 2016-04-16 MED ORDER — LORATADINE 10 MG PO TABS
10.0000 mg | ORAL_TABLET | Freq: Every day | ORAL | Status: DC
  Administered 2016-04-17 – 2016-04-18 (×2): 10 mg via ORAL
  Filled 2016-04-16 (×3): qty 1

## 2016-04-16 MED ORDER — OXYCODONE HCL 5 MG PO TABS
10.0000 mg | ORAL_TABLET | ORAL | Status: AC
  Administered 2016-04-16: 10 mg via ORAL
  Filled 2016-04-16: qty 2

## 2016-04-16 MED ORDER — MIDAZOLAM HCL 5 MG/5ML IJ SOLN
INTRAMUSCULAR | Status: DC | PRN
  Administered 2016-04-16: 2 mg via INTRAVENOUS

## 2016-04-16 MED ORDER — SODIUM CHLORIDE 0.9 % IV SOLN
INTRAVENOUS | Status: DC
Start: 2016-04-16 — End: 2016-04-18
  Administered 2016-04-16 (×2): via INTRAVENOUS

## 2016-04-16 MED ORDER — MONTELUKAST SODIUM 10 MG PO TABS
10.0000 mg | ORAL_TABLET | Freq: Every day | ORAL | Status: DC
  Administered 2016-04-16 – 2016-04-17 (×2): 10 mg via ORAL
  Filled 2016-04-16 (×2): qty 1

## 2016-04-16 MED ORDER — FENTANYL CITRATE (PF) 100 MCG/2ML IJ SOLN
25.0000 ug | INTRAMUSCULAR | Status: DC | PRN
  Administered 2016-04-16 (×2): 25 ug via INTRAVENOUS

## 2016-04-16 MED ORDER — ADULT MULTIVITAMIN W/MINERALS CH
1.0000 | ORAL_TABLET | Freq: Every day | ORAL | Status: DC
  Administered 2016-04-17 – 2016-04-18 (×2): 1 via ORAL
  Filled 2016-04-16 (×3): qty 1

## 2016-04-16 MED ORDER — NEOMYCIN-POLYMYXIN B GU 40-200000 IR SOLN
Status: DC | PRN
  Administered 2016-04-16: 16 mL

## 2016-04-16 MED ORDER — METOCLOPRAMIDE HCL 10 MG PO TABS
5.0000 mg | ORAL_TABLET | Freq: Three times a day (TID) | ORAL | Status: DC | PRN
  Administered 2016-04-16: 10 mg via ORAL
  Filled 2016-04-16: qty 1

## 2016-04-16 MED ORDER — LOSARTAN POTASSIUM 50 MG PO TABS
50.0000 mg | ORAL_TABLET | Freq: Every day | ORAL | Status: DC
  Administered 2016-04-17 – 2016-04-18 (×2): 50 mg via ORAL
  Filled 2016-04-16 (×3): qty 1

## 2016-04-16 MED ORDER — ALUM & MAG HYDROXIDE-SIMETH 200-200-20 MG/5ML PO SUSP
30.0000 mL | ORAL | Status: DC | PRN
  Administered 2016-04-16 – 2016-04-17 (×2): 30 mL via ORAL
  Filled 2016-04-16 (×3): qty 30

## 2016-04-16 MED ORDER — PHENOL 1.4 % MT LIQD
1.0000 | OROMUCOSAL | Status: DC | PRN
  Filled 2016-04-16: qty 177

## 2016-04-16 MED ORDER — DIPHENHYDRAMINE HCL 12.5 MG/5ML PO ELIX: 12.5000 mg | ORAL_SOLUTION | ORAL | Status: DC | PRN

## 2016-04-16 SURGICAL SUPPLY — 64 items
AUTOTRANSFUS HAS 1/8 (MISCELLANEOUS) ×3
BAG DECANTER FOR FLEXI CONT (MISCELLANEOUS) IMPLANT
BLADE DEBAKEY 8.0 (BLADE) ×2 IMPLANT
BLADE DEBAKEY 8.0MM (BLADE) ×1
BLADE SAW 1 (BLADE) ×3 IMPLANT
BLADE SAW 1/2 (BLADE) ×3 IMPLANT
BLADE SURG 15 STRL LF DISP TIS (BLADE) ×1 IMPLANT
BLADE SURG 15 STRL SS (BLADE) ×2
CANISTER SUCT 1200ML W/VALVE (MISCELLANEOUS) ×6 IMPLANT
CAP KNEE TOTAL 3 SIGMA ×3 IMPLANT
CATH TRAY METER 16FR LF (MISCELLANEOUS) ×3 IMPLANT
CEMENT HV SMART SET (Cement) ×6 IMPLANT
CNTNR SPEC 2.5X3XGRAD LEK (MISCELLANEOUS) ×1
CONT SPEC 4OZ STER OR WHT (MISCELLANEOUS) ×2
CONTAINER SPEC 2.5X3XGRAD LEK (MISCELLANEOUS) ×1 IMPLANT
COOLER POLAR GLACIER W/PUMP (MISCELLANEOUS) ×3 IMPLANT
CUFF TOURN 24 STER (MISCELLANEOUS) IMPLANT
CUFF TOURN 30 STER DUAL PORT (MISCELLANEOUS) IMPLANT
DRAPE IMP U-DRAPE 54X76 (DRAPES) ×3 IMPLANT
DRAPE INCISE IOBAN 66X60 STRL (DRAPES) ×3 IMPLANT
DRAPE SHEET LG 3/4 BI-LAMINATE (DRAPES) ×3 IMPLANT
DRAPE SURG 17X11 SM STRL (DRAPES) ×6 IMPLANT
DRSG OPSITE POSTOP 4X12 (GAUZE/BANDAGES/DRESSINGS) ×3 IMPLANT
DRSG OPSITE POSTOP 4X14 (GAUZE/BANDAGES/DRESSINGS) IMPLANT
DURAPREP 26ML APPLICATOR (WOUND CARE) ×9 IMPLANT
ELECT REM PT RETURN 9FT ADLT (ELECTROSURGICAL) ×3
ELECTRODE REM PT RTRN 9FT ADLT (ELECTROSURGICAL) ×1 IMPLANT
GAUZE PETRO XEROFOAM 1X8 (MISCELLANEOUS) ×3 IMPLANT
GAUZE SPONGE 4X4 12PLY STRL (GAUZE/BANDAGES/DRESSINGS) ×3 IMPLANT
GLOVE BIOGEL PI IND STRL 9 (GLOVE) ×1 IMPLANT
GLOVE BIOGEL PI INDICATOR 9 (GLOVE) ×2
GLOVE SURG 9.0 ORTHO LTXF (GLOVE) ×9 IMPLANT
GOWN STRL REUS TWL 2XL XL LVL4 (GOWN DISPOSABLE) ×3 IMPLANT
GOWN STRL REUS W/ TWL LRG LVL3 (GOWN DISPOSABLE) ×4 IMPLANT
GOWN STRL REUS W/TWL LRG LVL3 (GOWN DISPOSABLE) ×8
GOWN STRL REUS W/TWL XL LVL4 (GOWN DISPOSABLE) ×3 IMPLANT
HANDPIECE SUCTION TUBG SURGILV (MISCELLANEOUS) ×3 IMPLANT
IMMBOLIZER KNEE 19 BLUE UNIV (SOFTGOODS) ×3 IMPLANT
IV NS 100ML SINGLE PACK (IV SOLUTION) ×3 IMPLANT
KIT RM TURNOVER STRD PROC AR (KITS) ×3 IMPLANT
NDL SAFETY 18GX1.5 (NEEDLE) ×3 IMPLANT
NDL SAFETY 22GX1.5 (NEEDLE) ×3 IMPLANT
NEEDLE SPNL 20GX3.5 QUINCKE YW (NEEDLE) ×3 IMPLANT
NS IRRIG 1000ML POUR BTL (IV SOLUTION) ×3 IMPLANT
PACK TOTAL KNEE (MISCELLANEOUS) ×3 IMPLANT
PAD PREP 24X41 OB/GYN DISP (PERSONAL CARE ITEMS) ×3 IMPLANT
PAD WRAPON POLAR KNEE (MISCELLANEOUS) ×1 IMPLANT
SOL .9 NS 3000ML IRR  AL (IV SOLUTION) ×2
SOL .9 NS 3000ML IRR UROMATIC (IV SOLUTION) ×1 IMPLANT
SPONGE DRAIN TRACH 4X4 STRL 2S (GAUZE/BANDAGES/DRESSINGS) ×3 IMPLANT
SPONGE LAP 18X18 5 PK (GAUZE/BANDAGES/DRESSINGS) ×3 IMPLANT
STAPLER SKIN PROX 35W (STAPLE) ×3 IMPLANT
SUCTION FRAZIER HANDLE 10FR (MISCELLANEOUS) ×2
SUCTION TUBE FRAZIER 10FR DISP (MISCELLANEOUS) ×1 IMPLANT
SUT ETHIBOND NAB CT1 #1 30IN (SUTURE) ×6 IMPLANT
SUT MNCRL AB 3-0 PS2 27 (SUTURE) ×6 IMPLANT
SUT VIC AB 0 CT1 36 (SUTURE) ×3 IMPLANT
SUT VIC AB 2-0 CT1 (SUTURE) ×6 IMPLANT
SYR 20CC LL (SYRINGE) ×3 IMPLANT
SYR 50ML LL SCALE MARK (SYRINGE) ×6 IMPLANT
SYSTEM AUTOTRANSFUS DUAL TROCR (MISCELLANEOUS) ×1 IMPLANT
TOWER CARTRIDGE SMART MIX (DISPOSABLE) ×3 IMPLANT
TUBE SUCT KAM VAC (TUBING) ×3 IMPLANT
WRAPON POLAR PAD KNEE (MISCELLANEOUS) ×3

## 2016-04-16 NOTE — Op Note (Signed)
DATE OF SURGERY:  04/16/2016 TIME: 12:46 PM  PATIENT NAME:  Jesus Blevins   AGE: 70 y.o.    PRE-OPERATIVE DIAGNOSIS:  Unilateral primary osteoarthritis, left knee  POST-OPERATIVE DIAGNOSIS:  Same  PROCEDURE:  Procedure(s): LEFT TOTAL KNEE ARTHROPLASTY  SURGEON:  Thornton Park, MD   ASSISTANT:  Surgical tech  OPERATIVE IMPLANTS: Depuy PFC Sigma, Posterior Stabilized Femural component size 4, Tibia size rotating platform component size 5, Patella polyethylene 3-peg oval button size 41, with a 12.5 mm polyethylene insert.   PREOPERATIVE INDICATIONS:  Jesus Blevins is an 70 y.o. male who has a diagnosis of left knee osteoarthritis and elected for a left total knee arthroplasty after failing nonoperative treatment, including hyaluronic acid and corticosteroid injections who has significant impairment of his activities of daily living.  Radiographs have demonstrated tricompartmental osteoarthritis joint space narrowing, osteophytes, subchondral sclerosis and cyst formation.  The risks, benefits, and alternatives were discussed at length including but not limited to the risks of infection, bleeding, nerve or blood vessel injury, knee stiffness, fracture, dislocation, loosening or failure of the hardware and the need for further surgery. Medical risks include but not limited to DVT and pulmonary embolism, myocardial infarction, stroke, pneumonia, respiratory failure and death. I discussed these risks with the patient in my office prior to the date of surgery. They understood these risks and were willing to proceed.  OPERATIVE FINDINGS AND UNIQUE ASPECTS OF THE CASE:  Tricompartmental osteoarthritis   OPERATIVE DESCRIPTION:  The patient was brought to the operative room and placed in a supine position after undergoing placement of a spinal anesthetic.  A Foley catheter was placed.  IV antibiotics were given. Patient received 2 grams of ancef. The lower extremity was prepped and draped in the  usual sterile fashion.  A time out was performed to verify the patient's name, date of birth, medical record number, correct site of surgery and correct procedure to be performed. The timeout was also used to confirm the patient received antibiotics and that appropriate instruments, implants and radiographs studies were available in the room.  The leg was elevated and exsanguinated with an Esmarch and the tourniquet was inflated to 275 mmHg for 130 minutes..  A midline incision was made over the left knee. Full-thickness skin flaps were developed. A medial parapatellar arthrotomy was then made and the patella everted and the knee was brought into 90 of flexion. Hoffa's fat pad along with the cruciate ligaments and medial and lateral menisci were resected.   The distal femoral intramedullary canal was opened with a drill and the intramedullary distal femoral cutting jig was inserted into the femoral canal pinned into position. It was set at 5 degrees resecting 10 mm off the distal femur.  Care was taken to protect the collateral ligaments during distal femoral resection.  The distal femoral resection was performed with an oscillating saw. The femoral cutting guide was then removed.  The extramedullary tibial cutting guide was then placed using the anterior tibial crest and second ray of the foot as a references.  The tibial cutting guide was adjusted to allow for appropriate posterior slope.  The tibial cutting block was pinned into position. The slotted stylus was used to measure the proximal tibial resection of 10 mm off the high lateral side.  The tibial long rod alignment guide was then used to confirm position of the cutting block. A third cross pin through the tibial cutting block was then drilled into position to allow for rotational stability. Care  was taken during the tibial resection to protect the medial and collateral ligaments.  The resected tibial bone was removed along with the posterior horns  of the menisci.  The PCL was sacrificed.  Extension gap was measured with a spacer block and alignment and extension was confirmed using a long alignment rod.  The attention was then turned back to the femur. The posterior referencing distal femoral sizing guide was applied to the distal femur.  The femur was sized to be a 4. Rotation of the referencing guide was checked with the epicondylar axis and Whitesides line. Then the 4-in-1 cutting jig was then applied to the distal femur. A stylus was used to confirm that the anterior femur would not be notched.   Then the anterior, posterior and chamfer femoral cuts were then made with an oscillating saw.  All posterior osteophytes were removed.  The flexion gap was then measured with a flexion spacer block and long alignment rod and was found to be symmetric with the extension gap and perpendicular to mechanical axis of the tibia.  The distal femoral preparation was completed by performing the posterior stabilized box cut using the cutting block. The entry site for the intramedullary femoral guide was filled with autologous bone graft from bone previously resected earlier in the case.  The proximal tibia plateau was then sized with trial trays. The best coverage was achieved with a size 5. This tibial tray was then pinned into position. The proximal tibia was then prepared with the reamer and keel punch.  After tibial preparation was completed, all trial components were inserted including a polyethylene insert.  The knee was found to have excellent balance and full motion with a size 12.5 mm tibial polyethylene insert..    The attention was then turned to preparation of the patella. The thickness of the patella was measured with a caliper, the diameter measured with the patella templates.  The patella resection was then made with an oscillating saw using the patella cutting guide.  The final patellar thickness 15 mm.  3 peg holes for the patella component were  then drilled. The trial patella was then placed. Knee was taken through a full range of motion and deemed to be stable with the trial components. All trial components were then removed. The knee capsule was then injected with Exparel. The joint was copiously irrigated with pulse lavage.  The final total knee arthroplasty components were then cemented into place and all excess methylmethacrylate was removed.  The joint was again copiously irrigated. After the cement had hardened the knee was again taken through a full range of motion. It was felt to be most stable with the 12.5 mm tibial polyethylene insert. The actual tibial polyethylene insert was then placed.   The knee was taken through a range of motion and the patella tracked well and the knee was again irrigated copiously.    An Autovac drain was placed. The 2 drain limbs were brought out through the superior lateral knee. The medial arthrotomy was closed with #1 Ethibond. The subcutaneous tissue closed with 0 and 2-0 vicryl, and skin approximated with staples.  A dry sterile and compressive dressing was applied.  A Polar Care was applied to the operative knee along with TENS unit pads and a knee immobilizer.  The patient was awakened and brought to the PACU in stable and satisfactory condition.  All sharp, lap and instrument counts were correct at the conclusion the case. I spoke with the patient's family  in the postop consultation room to let them know the case was performed without complication and the patient was stable in recovery room.   Total tourniquet time was 130 minutes.

## 2016-04-16 NOTE — OR Nursing (Signed)
autovac activated at 1159.

## 2016-04-16 NOTE — Transfer of Care (Signed)
Immediate Anesthesia Transfer of Care Note  Patient: Jesus Blevins  Procedure(s) Performed: Procedure(s): TOTAL KNEE ARTHROPLASTY (Left)  Patient Location: PACU  Anesthesia Type:Spinal  Level of Consciousness: awake  Airway & Oxygen Therapy: Patient Spontanous Breathing and Patient connected to face mask oxygen  Post-op Assessment: Report given to RN  Post vital signs: Reviewed  Last Vitals:  Filed Vitals:   04/16/16 0718 04/16/16 1213  BP: 133/82 88/64  Pulse: 56 54  Temp: 36.2 C 36.2 C  Resp: 16 18    Last Pain:  Filed Vitals:   04/16/16 1216  PainSc: 3       Patients Stated Pain Goal: 0 ([id number])  Complications: No apparent anesthesia complications

## 2016-04-16 NOTE — Progress Notes (Signed)
BP 86/56 patient experiencing a lot of pain. Dr. Mack Guise notified. 500cc bolus ordered.

## 2016-04-16 NOTE — Progress Notes (Signed)
PT Cancellation Note  Patient Details Name: Jesus Blevins MRN: [id number] DOB: Apr 05, 1946   Cancelled Treatment:    Reason Eval/Treat Not Completed: Other (comment).  Attempted PT eval but pt c/o L foot still being numb (LE sensation tested and full sensation not returned post-op yet in L foot) and pt also c/o 10/10 pain in L knee even with pain meds (nursing notified).  Will re-attempt PT eval tomorrow.   Raquel Sarna Avynn Klassen 04/16/2016, 4:29 PM Leitha Bleak, [place]

## 2016-04-16 NOTE — Anesthesia Procedure Notes (Addendum)
Spinal Patient location during procedure: OR Staffing Anesthesiologist: KEPHART, WILLIAM K Resident/CRNA: Devere Brem Performed by: resident/CRNA  Preanesthetic Checklist Completed: patient identified, site marked, surgical consent, pre-op evaluation, timeout performed, IV checked, risks and benefits discussed and monitors and equipment checked Spinal Block Patient position: sitting Prep: Betadine and site prepped and draped Patient monitoring: heart rate, continuous pulse ox, blood pressure and cardiac monitor Approach: midline Location: L3-4 Injection technique: single-shot Needle Needle type: Whitacre and Introducer  Needle gauge: 24 G Needle length: 9 cm Additional Notes Negative paresthesia. Negative blood return. Positive free-flowing CSF. Expiration date of kit checked and confirmed. Patient tolerated procedure well, without complications.     

## 2016-04-16 NOTE — NC FL2 (Signed)
[place] LEVEL OF CARE SCREENING TOOL     IDENTIFICATION  Patient Name: Jesus Blevins Birthdate: May 23, 1946 Sex: male Admission Date (Current Location): 04/16/2016  [place] and [place] Number:  Engineering geologist and Address:  [facility], [address] [place], [place] [address]      Provider Number: [id number]  Attending Physician Name and Address:  Thornton Park, MD  Relative Name and Phone Number:       Current Level of Care: Hospital Recommended Level of Care: [place] Prior Approval Number:    Date Approved/Denied:   PASRR Number:  ([id number])  Discharge Plan: SNF    Current Diagnoses: Patient Active Problem List   Diagnosis Date Noted  . Status post total left knee replacement 04/16/2016   Acute left otitis media 01/29/2016  Essential hypertension 01/16/2016  Hyperlipidemia, unspecified 01/16/2016  Recurrent major depressive disorder, in partial remission (CMS-HCC) 01/16/2016  Constipation, unspecified constipation type 03/16/2015  Hip pain 06/24/2014  Status post lumbar spine operation 06/03/2014  Lumbar radiculopathy 04/26/2014  Dysphagia, unspecified 03/21/2014  Carpal tunnel syndrome 03/21/2014  Vitamin D deficiency disease, unspecified 03/21/2014  Anemia, unspecified 03/21/2014  CAD (coronary artery disease)      Orientation RESPIRATION BLADDER Height & Weight     Self, Time, Situation, Place  Normal Incontinent, Indwelling catheter Weight: 209 lb 12.8 oz (95.165 kg) Height:  5\' 11"  (180.3 cm)  BEHAVIORAL SYMPTOMS/MOOD NEUROLOGICAL BOWEL NUTRITION STATUS   (none )  (none ) Continent Diet (Diet: Clear Liquid )  AMBULATORY STATUS COMMUNICATION OF NEEDS Skin   Extensive Assist Verbally Surgical wounds (Incision: Left Knee )                       Personal Care Assistance Level of Assistance  Bathing, Feeding, Dressing Bathing Assistance: Limited assistance Feeding assistance:  Independent Dressing Assistance: Limited assistance     Functional Limitations Info  Sight, Hearing, Speech Sight Info: Adequate Hearing Info: Adequate Speech Info: Adequate    SPECIAL CARE FACTORS FREQUENCY  PT (By licensed PT), OT (By licensed OT)     PT Frequency:  (5) OT Frequency:  (5)            Contractures      Additional Factors Info  Code Status, Allergies Code Status Info:  (Not on file ) Allergies Info:  (Sulfa Antibiotics, Elavil)           Current Medications (04/16/2016):  This is the current hospital active medication list Current Facility-Administered Medications  Medication Dose Route Frequency Provider Last Rate Last Dose  . 0.9 %  sodium chloride infusion   Intravenous Continuous Thornton Park, MD      . acetaminophen (TYLENOL) tablet 650 mg  650 mg Oral Q6H PRN Thornton Park, MD       Or  . acetaminophen (TYLENOL) suppository 650 mg  650 mg Rectal Q6H PRN Thornton Park, MD      . ALPRAZolam Duanne Moron) tablet 0.5 mg  0.5 mg Oral BID PRN Thornton Park, MD      . alum & mag hydroxide-simeth (MAALOX/MYLANTA) 200-200-20 MG/5ML suspension 30 mL  30 mL Oral Q4H PRN Thornton Park, MD      . aspirin EC tablet 81 mg  81 mg Oral Daily Loleta Dicker, RPH      . atorvastatin (LIPITOR) tablet 40 mg  40 mg Oral QHS Thornton Park, MD      . azelastine (ASTELIN) 0.1 % nasal  spray 1 spray  1 spray Each Nare BID Thornton Park, MD      . bisacodyl (DULCOLAX) suppository 10 mg  10 mg Rectal Daily PRN Thornton Park, MD      . calcium-vitamin D (OSCAL WITH D) 500-200 MG-UNIT per tablet 1 tablet  1 tablet Oral Once Thornton Park, MD      . ceFAZolin (ANCEF) 2-4 GM/100ML-% IVPB           . ceFAZolin (ANCEF) IVPB 2g/100 mL premix  2 g Intravenous Q6H Thornton Park, MD      . diphenhydrAMINE (BENADRYL) 12.5 MG/5ML elixir 12.5-25 mg  12.5-25 mg Oral Q4H PRN Thornton Park, MD      . docusate sodium (COLACE) capsule 100 mg  100 mg Oral BID Thornton Park, MD      . Derrill Memo ON 04/17/2016] enoxaparin (LOVENOX) injection 30 mg  30 mg Subcutaneous Q12H Thornton Park, MD      . fentaNYL (SUBLIMAZE) 100 MCG/2ML injection           . fluticasone (FLONASE) 50 MCG/ACT nasal spray 1 spray  1 spray Each Nare Daily Thornton Park, MD      . HYDROmorphone (DILAUDID) injection 1 mg  1 mg Intravenous Q2H PRN Thornton Park, MD      . loratadine (CLARITIN) tablet 10 mg  10 mg Oral Daily Thornton Park, MD      . losartan (COZAAR) tablet 50 mg  50 mg Oral Daily Thornton Park, MD      . magnesium hydroxide (MILK OF MAGNESIA) suspension 30 mL  30 mL Oral Daily PRN Thornton Park, MD      . menthol-cetylpyridinium (CEPACOL) lozenge 3 mg  1 lozenge Oral PRN Thornton Park, MD       Or  . phenol (CHLORASEPTIC) mouth spray 1 spray  1 spray Mouth/Throat PRN Thornton Park, MD      . methocarbamol (ROBAXIN) tablet 500 mg  500 mg Oral Q6H PRN Thornton Park, MD       Or  . methocarbamol (ROBAXIN) 500 mg in dextrose 5 % 50 mL IVPB  500 mg Intravenous Q6H PRN Thornton Park, MD      . metoCLOPramide (REGLAN) tablet 5-10 mg  5-10 mg Oral Q8H PRN Thornton Park, MD       Or  . metoCLOPramide (REGLAN) injection 5-10 mg  5-10 mg Intravenous Q8H PRN Thornton Park, MD      . montelukast (SINGULAIR) tablet 10 mg  10 mg Oral QHS Thornton Park, MD      . multivitamin with minerals tablet 1 tablet  1 tablet Oral Daily Crystal G Scarpena, RPH      . ondansetron (ZOFRAN) tablet 4 mg  4 mg Oral Q6H PRN Thornton Park, MD       Or  . ondansetron [facility]) injection 4 mg  4 mg Intravenous Q6H PRN Thornton Park, MD      . oxyCODONE (Oxy IR/ROXICODONE) immediate release tablet 10-15 mg  10-15 mg Oral Q3H PRN Thornton Park, MD      . pantoprazole (PROTONIX) EC tablet 40 mg  40 mg Oral Daily Thornton Park, MD      . polyethylene glycol (MIRALAX / GLYCOLAX) packet 17 g  17 g Oral Daily PRN Thornton Park, MD      . sertraline (ZOLOFT) tablet 50 mg  50 mg  Oral QHS Thornton Park, MD      . zolpidem [facility]) tablet 5 mg  5 mg Oral QHS PRN Thornton Park,  MD         Discharge Medications: Please see discharge summary for a list of discharge medications.  Relevant Imaging Results:  Relevant Lab Results:   Additional Information  (SSN: [id number])  Loralyn Freshwater, LCSW

## 2016-04-16 NOTE — Progress Notes (Signed)
Notified Dr. Ronelle Nigh of heart rate range from 47-63.  No further orders and not a problem he said.  All other vitals Stable and patient alert.  Denies any pain or distress.

## 2016-04-16 NOTE — H&P (Signed)
The patient has been re-examined, and the chart reviewed, and there have been no interval changes to the documented history and physical.  Patient is seen in pre-op with his wife and pastor.    I re-examined the patient in pre-op this AM.     HEENT:  PERRLA, EOMI, No oral lesions.  His sinuses are clear.  No symptoms of infection.  He has been treated with antibiotics 2 weeks ago by Dr. Tami Ribas.  CARDIAC:  RRR, No M/R/G  CHEST:  CTA bilaterally  ABDOMEN:  Soft, NT, ND, +BS  The risks, benefits, and alternatives have been discussed at length, and the patient is willing to proceed.  All questions from the patient and his wife were answered.

## 2016-04-16 NOTE — Progress Notes (Signed)
Dr. Ronelle Nigh aware of heart rate, ranges between 43-47 then up to 54.  No further orders.  Will continue to Monitor.

## 2016-04-16 NOTE — Progress Notes (Signed)
Subjective:  POST-OP CHECK:  Patient reports pain as marked in left knee.  Patient on CPM currently. His wife is at the bedside. Patient had episode of hypotension earlier. This was treated successfully with a 500 cc bolus of fluid. Patient has tolerated by mouth intake. Patient received 1 unit from his Autovac earlier. He is now on Hemovac.  Objective:   VITALS:   Filed Vitals:   04/16/16 1649 04/16/16 1759 04/16/16 1843 04/16/16 1926  BP: 137/70 120/61 119/58 140/82  Pulse: 74 88 86 78  Temp: 97.6 F (36.4 C) 98.1 F (36.7 C) 98.3 F (36.8 C) 98.9 F (37.2 C)  TempSrc: Oral Oral Oral Oral  Resp: 18  18 18   Height:      Weight:      SpO2: 94% 93% 97% 93%    PHYSICAL EXAM:  Left knee: Patient's dressing is clean dry and intact. Polar Care is in place along with a TENS unit.  Patient has palpable pedal pulses. He has intact sensation to light touch. He can flex and extend his toes and dorsiflex and plantarflex his ankle. Hemovac drain is in place.    LABS  Results for orders placed or performed during the hospital encounter of 04/16/16 (from the past 24 hour(s))  Type and screen [place]     Status: None   Collection Time: 04/16/16  7:47 AM  Result Value Ref Range   ABO/RH(D) O POS    Antibody Screen NEG    Sample Expiration 04/19/2016   CBC     Status: Abnormal   Collection Time: 04/16/16  2:18 PM  Result Value Ref Range   WBC 9.1 3.8 - 10.6 K/uL   RBC 4.37 (L) 4.40 - 5.90 MIL/uL   Hemoglobin 13.2 13.0 - 18.0 g/dL   HCT 38.8 (L) 40.0 - 52.0 %   MCV 88.8 80.0 - 100.0 fL   MCH 30.2 26.0 - 34.0 pg   MCHC 34.0 32.0 - 36.0 g/dL   RDW 12.7 11.5 - 14.5 %   Platelets 172 150 - 440 K/uL  Creatinine, serum     Status: None   Collection Time: 04/16/16  2:18 PM  Result Value Ref Range   Creatinine, Ser 0.78 0.61 - 1.24 mg/dL   GFR calc non Af Amer >60 >60 mL/min   GFR calc Af Amer >60 >60 mL/min    Dg Knee Left Port  04/16/2016  CLINICAL DATA:   Status post total knee replacement EXAM: PORTABLE LEFT KNEE - 1-2 VIEW COMPARISON:  None. FINDINGS: Frontal and lateral views were obtained. The patient is status post total knee replacement with femoral and tibial prosthetic components appearing well-seated. No fracture or dislocation. Drains are noted in the knee joint region. There are overlying skin staples as well as mild soft tissue air, expected postoperative findings. IMPRESSION: Femoral and tibial prosthetic components appear well seated. No acute fracture or dislocation. Electronically Signed   By: Lowella Grip III M.D.   On: 04/16/2016 13:16    Assessment/Plan: Day of Surgery   Active Problems:   Status post total left knee replacement  Patient is stable postop. Pain is the major issue currently. I'm going to add Toradol 15 mg IV every 6 hours when necessary to his pain medication regimen.  His CPM was discontinued for tonight. Patient with a Hemovac drain removed tomorrow. His Foley catheter will be removed tomorrow. He will complete 24 hours of postop antibiotics. He'll begin physical and occupational therapy tomorrow. Patient  will have labs redrawn in the morning. He was encouraged to use incentive spirometry while awake.    Thornton Park , MD 04/16/2016, 7:53 PM

## 2016-04-16 NOTE — Anesthesia Preprocedure Evaluation (Signed)
Anesthesia Evaluation  Patient identified by MRN, date of birth, ID band Patient awake    Reviewed: Allergy & Precautions, NPO status , Patient's Chart, lab work & pertinent test results  History of Anesthesia Complications Negative for: history of anesthetic complications  Airway Mallampati: II       Dental   Pulmonary neg pulmonary ROS,           Cardiovascular hypertension, Pt. on medications + CAD       Neuro/Psych Anxiety Depression negative neurological ROS     GI/Hepatic Neg liver ROS, GERD  Medicated and Controlled,  Endo/Other  negative endocrine ROS  Renal/GU negative Renal ROS     Musculoskeletal   Abdominal   Peds  Hematology  (+) anemia ,   Anesthesia Other Findings   Reproductive/Obstetrics                             Anesthesia Physical Anesthesia Plan  ASA: II  Anesthesia Plan: Spinal   Post-op Pain Management:    Induction: Intravenous  Airway Management Planned:   Additional Equipment:   Intra-op Plan:   Post-operative Plan:   Informed Consent: I have reviewed the patients History and Physical, chart, labs and discussed the procedure including the risks, benefits and alternatives for the proposed anesthesia with the patient or authorized representative who has indicated his/her understanding and acceptance.     Plan Discussed with:   Anesthesia Plan Comments:         Anesthesia Quick Evaluation

## 2016-04-17 ENCOUNTER — Encounter: Payer: Self-pay | Admitting: Orthopedic Surgery

## 2016-04-17 LAB — CBC
HCT: 34.9 % — ABNORMAL LOW (ref 40.0–52.0)
Hemoglobin: 12.1 g/dL — ABNORMAL LOW (ref 13.0–18.0)
MCH: 30.6 pg (ref 26.0–34.0)
MCHC: 34.8 g/dL (ref 32.0–36.0)
MCV: 87.9 fL (ref 80.0–100.0)
PLATELETS: 164 10*3/uL (ref 150–440)
RBC: 3.97 MIL/uL — AB (ref 4.40–5.90)
RDW: 13.1 % (ref 11.5–14.5)
WBC: 9.5 10*3/uL (ref 3.8–10.6)

## 2016-04-17 LAB — BASIC METABOLIC PANEL
ANION GAP: 7 (ref 5–15)
BUN: 14 mg/dL (ref 6–20)
CO2: 26 mmol/L (ref 22–32)
Calcium: 8.6 mg/dL — ABNORMAL LOW (ref 8.9–10.3)
Chloride: 106 mmol/L (ref 101–111)
Creatinine, Ser: 0.89 mg/dL (ref 0.61–1.24)
GFR calc Af Amer: >60 mL/min (ref >60)
Glucose, Bld: 119 mg/dL — ABNORMAL HIGH (ref 65–99)
POTASSIUM: 3.8 mmol/L (ref 3.5–5.1)
SODIUM: 139 mmol/L (ref 135–145)

## 2016-04-17 NOTE — Progress Notes (Signed)
Clinical Education officer, museum (CSW) received SNF consult. PT is recommending home health. RN case manager is aware of above. Please reconsult if future social work needs arise. CSW signing off.   Blima Rich, LCSW [phone]

## 2016-04-17 NOTE — Anesthesia Postprocedure Evaluation (Signed)
Anesthesia Post Note  Patient: ANAN HOARD  Procedure(s) Performed: Procedure(s) (LRB): TOTAL KNEE ARTHROPLASTY (Left)  Patient location during evaluation: Nursing Unit Anesthesia Type: Spinal Level of consciousness: awake and alert and oriented Pain management: pain level controlled Vital Signs Assessment: post-procedure vital signs reviewed and stable Respiratory status: spontaneous breathing, nonlabored ventilation and respiratory function stable Cardiovascular status: blood pressure returned to baseline Postop Assessment: no headache, no backache, no signs of nausea or vomiting and patient able to bend at knees Anesthetic complications: no    Last Vitals:  Filed Vitals:   04/16/16 2341 04/17/16 0345  BP: 123/67 110/56  Pulse: 97 77  Temp: 37.5 C 37.2 C  Resp: 18 18    Last Pain:  Filed Vitals:   04/17/16 0636  PainSc: Asleep                 Johnna Acosta

## 2016-04-17 NOTE — Progress Notes (Signed)
Physical Therapy Treatment Patient Details Name: Jesus Blevins MRN: [id number] DOB: Dec 27, 1945 Today's Date: 04/17/2016    History of Present Illness Pt is a 70 y.o. 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  Pt with low HR and episode of hypotension post-op.  PMH includes htn, anxiety, depression, anemia, back surgery.    PT Comments    Pt able to progress to ambulating 60 feet with RW and L KI CGA; limited distance d/t fatigue and some L knee pain (3-4/10 during session).  Will continue to progress pt with decreasing assist needs with functional mobility and increasing ambulation distance per pt tolerance next session; will also focus on L knee ROM and strengthening activities.   Follow Up Recommendations  Supervision for mobility/OOB;Outpatient PT     Equipment Recommendations  Rolling walker with 5" wheels;3in1 (PT)    Recommendations for Other Services       Precautions / Restrictions Precautions Precautions: Fall Required Braces or Orthoses: Knee Immobilizer - Left Knee Immobilizer - Left: On except when in CPM Restrictions Weight Bearing Restrictions: Yes LLE Weight Bearing: Weight bearing as tolerated    Mobility  Bed Mobility         General bed mobility comments: deferred d/t pt in chair beginning of session and pt requesting to be in chair end of session  Transfers Overall transfer level: Needs assistance Equipment used: Rolling walker (2 wheeled) Transfers: Sit to/from Stand Sit to Stand: Min guard;Min assist         General transfer comment: vc's required for hand and feet placement  Ambulation/Gait Ambulation/Gait assistance: Min guard Ambulation Distance (Feet): 60 Feet Assistive device: Rolling walker (2 wheeled)   Gait velocity: decreased   General Gait Details: antalgic/decreased stance time L LE; vc's required for gait pattern and walker use   Stairs            Wheelchair Mobility    Modified Rankin (Stroke  Patients Only)       Balance Overall balance assessment: Needs assistance Sitting-balance support: Bilateral upper extremity supported;Feet supported Sitting balance-Leahy Scale: Good     Standing balance support: Bilateral upper extremity supported (on RW) Standing balance-Leahy Scale: Good                      Cognition Arousal/Alertness: Awake/alert Behavior During Therapy: WFL for tasks assessed/performed Overall Cognitive Status: Within Functional Limits for tasks assessed                      Exercises Total Joint Exercises Knee Flexion: Strengthening;Both;10 reps;Seated (AAROM L; AROM R) Hip flexion: strengthening; Both; 10 reps; seated (AAROM L; AROM R)  General Exercises - Lower Extremity Ankle Circles/Pumps: AROM;Strengthening;Both;10 reps;Seated Long Arc Quad: Strengthening;Both;10 reps;Seated (AAROM L; AROM R)    General Comments General comments (skin integrity, edema, etc.): L knee dressings and polar care in place (hemovac removed prior to PT session)      Pertinent Vitals/Pain Pain Assessment: 0-10 Pain Score: 4  Pain Location: L knee Pain Descriptors / Indicators: Aching;Sore;Tender;Operative site guarding Pain Intervention(s): Limited activity within patient's tolerance;Monitored during session;Premedicated before session;Repositioned;Ice applied  Vitals stable and WFL throughout treatment session.    Home Living Family/patient expects to be discharged to:: Private residence Living Arrangements: Spouse/significant other Available Help at Discharge: Family Type of Home: House Home Access: Level entry   [place]: One [place]: Environmental consultant - 4 wheels;Cane - single point;Wheelchair - Brewing technologist - built  in      Prior Function Level of Independence: Independent      Comments: Pt denies any falls in past 6 months.   PT Goals (current goals can now be found in the care plan section) Acute Rehab PT Goals Patient  Stated Goal: to go home PT Goal Formulation: With patient/family Time For Goal Achievement: 05/01/16 Potential to Achieve Goals: Good Progress towards PT goals: Progressing toward goals    Frequency  BID    PT Plan Current plan remains appropriate    Co-evaluation             End of Session Equipment Utilized During Treatment: Gait belt;Left knee immobilizer Activity Tolerance: Patient limited by pain Patient left: in chair;with call bell/phone within reach;with chair alarm set;with family/visitor present;with SCD's reapplied (B heels elevated via towel rolls; polar care in place and activated)     Time: [id number] PT Time Calculation (min) (ACUTE ONLY): 38 min  Charges:  $Gait Training: 8-22 mins $Therapeutic Exercise: 8-22 mins $Therapeutic Activity: 8-22 mins                    G CodesLeitha Bleak 05-May-2016, 3:24 PM Leitha Bleak, [place]

## 2016-04-17 NOTE — Evaluation (Signed)
Physical Therapy Evaluation Patient Details Name: Jesus Blevins MRN: [id number] DOB: [birth year] Today's Date: 04/17/2016   History of Present Illness  Pt is a 70 y.o. 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  Pt with low HR and episode of hypotension post-op.  PMH includes htn, anxiety, depression, anemia, back surgery.  Clinical Impression  Prior to admission, pt was independent with functional mobility without AD.  Pt lives with his wife in 1 level home.  Currently pt is min assist supine to sit, transfers, and short ambulation with RW and utilizing L KI.  Pt demonstrates pain and weakness in L LE (unable to perform SLR independently).  Pt would benefit from skilled PT to address noted impairments and functional limitations.  Recommend pt discharge to home with support of family and OP PT when medically appropriate.     Follow Up Recommendations Outpatient PT    Equipment Recommendations  Rolling walker with 5" wheels;3in1 (PT)    Recommendations for Other Services       Precautions / Restrictions Precautions Precautions: Fall Required Braces or Orthoses: Knee Immobilizer - Left Knee Immobilizer - Left: On except when in CPM Restrictions Weight Bearing Restrictions: Yes LLE Weight Bearing: Weight bearing as tolerated      Mobility  Bed Mobility Overal bed mobility: Needs Assistance Bed Mobility: Supine to Sit     Supine to sit: Min assist;HOB elevated     General bed mobility comments: increased time to perform; assist for L LE; vc's for technique required  Transfers Overall transfer level: Needs assistance Equipment used: Rolling walker (2 wheeled) Transfers: Sit to/from Stand Sit to Stand: Min assist         General transfer comment: vc's required for hand and feet placement; increased effort to stand  Ambulation/Gait Ambulation/Gait assistance: Min guard;Min assist Ambulation Distance (Feet): 8 Feet Assistive device: Rolling walker (2  wheeled)   Gait velocity: decreased   General Gait Details: antalgic/decreased stance time L LE; vc's required for gait pattern and walker use  Stairs            Wheelchair Mobility    Modified Rankin (Stroke Patients Only)       Balance Overall balance assessment: Needs assistance Sitting-balance support: Bilateral upper extremity supported;Feet supported Sitting balance-Leahy Scale: Good     Standing balance support: Bilateral upper extremity supported (on RW) Standing balance-Leahy Scale: Good                               Pertinent Vitals/Pain Pain Assessment: No/denies pain Pain Score: 5  Pain Location: L knee Pain Descriptors / Indicators: Aching;Sore;Tender;Operative site guarding Pain Intervention(s): Limited activity within patient's tolerance;Monitored during session;Premedicated before session;Repositioned;Ice applied    Home Living Family/patient expects to be discharged to:: Private residence Living Arrangements: Spouse/significant other Available Help at Discharge: Family Type of Home: House Home Access: Level entry     Home Layout: One [place]: Environmental consultant - 4 wheels;Cane - single point;Wheelchair - Brewing technologist - built in      Prior Function Level of Independence: Independent         Comments: Pt denies any falls in past 6 months.     Hand Dominance        Extremity/Trunk Assessment   Upper Extremity Assessment: Overall WFL for tasks assessed           Lower Extremity Assessment: RLE deficits/detail;LLE deficits/detail RLE Deficits /  Details: R LE strength and ROM WFL LLE Deficits / Details: L hip flexion at least 3+/5; L knee flexion/extension at least 2+/5 (limited d/t pain); L DF at least 4/5  Cervical / Trunk Assessment: Normal  Communication   Communication: No difficulties  Cognition Arousal/Alertness: Awake/alert Behavior During Therapy: WFL for tasks assessed/performed Overall Cognitive  Status: Within Functional Limits for tasks assessed                      General Comments General comments (skin integrity, edema, etc.): L knee dressings and hemovac and polar care in place  Nursing cleared pt for participation in physical therapy.  Pt agreeable to PT session. [name] present for entire session.    Exercises Total Joint Exercises Ankle Circles/Pumps: AROM;Strengthening;Both;10 reps;Supine Quad Sets: AROM;Strengthening;Both;10 reps;Supine Short Arc Quad: Strengthening;Both;10 reps;Supine (AROM R; AAROM L) Heel Slides: Strengthening;Both;10 reps;Supine (AROM R; AAROM L) Hip ABduction/ADduction: Strengthening;Both;10 reps;Supine (AROM R; AAROM L) Straight Leg Raises: Strengthening;Both;10 reps;Supine (AROM R; AAROM L) Goniometric ROM: L knee extension 12 degrees short of neutral semi-supine in bed; L knee flexion 75 degrees flexion in sitting.      Assessment/Plan    PT Assessment Patient needs continued PT services  PT Diagnosis Difficulty walking;Acute pain   PT Problem List Decreased strength;Decreased range of motion;Decreased activity tolerance;Decreased balance;Decreased mobility;Decreased knowledge of use of DME;Decreased knowledge of precautions;Pain  PT Treatment Interventions DME instruction;Gait training;Stair training;Functional mobility training;Therapeutic activities;Therapeutic exercise;Balance training;Neuromuscular re-education;Patient/family education   PT Goals (Current goals can be found in the Care Plan section) Acute Rehab PT Goals Patient Stated Goal: to go home PT Goal Formulation: With patient/family Time For Goal Achievement: 05/01/16 Potential to Achieve Goals: Good    Frequency BID   Barriers to discharge        Co-evaluation               End of Session Equipment Utilized During Treatment: Gait belt;Left knee immobilizer Activity Tolerance: Patient limited by pain Patient left: in chair;with call bell/phone within  reach;with chair alarm set;with family/visitor present;with SCD's reapplied (B heels elevated via towel rolls; polar care in place and activated) Nurse Communication: Mobility status;Precautions         Time: [id number] PT Time Calculation (min) (ACUTE ONLY): 50 min   Charges:   PT Evaluation $PT Eval Low Complexity: 1 Procedure PT Treatments $Therapeutic Exercise: 23-37 mins   PT G CodesLeitha Bleak 2016-05-09, 1:38 PM Leitha Bleak, [place]

## 2016-04-17 NOTE — Progress Notes (Signed)
Subjective:  Post operative day #1 status post left total knee arthroplasty. Patient reports pain as moderate.  Patient is seen with his wife at the bedside. He is up out of bed to a chair. His left knee is in the immobilizer. There is a towel roll under his ankles. Patient denies shortness of breath chest pain or nausea or vomiting. His Foley catheter has been removed and he has been able to urinate since its removal. He was able to tolerate physical therapy this morning. His pain is better controlled today compared to last night. He received his first Lovenox injection this morning.  Objective:   VITALS:   Filed Vitals:   04/16/16 1926 04/16/16 2341 04/17/16 0345 04/17/16 0748  BP: 140/82 123/67 110/56 120/65  Pulse: 78 97 77 72  Temp: 98.9 F (37.2 C) 99.5 F (37.5 C) 98.9 F (37.2 C) 99.6 F (37.6 C)  TempSrc: Oral Oral Oral Oral  Resp: 18 18 18 18   Height:      Weight:      SpO2: 93% 95% 92% 93%    PHYSICAL EXAM:  Left knee: Patient's bandage remains clean dry and intact. He has no calf tenderness or lower leg edema. His leg compartments are soft and compressible. He has palpable pedal pulses, intact sensation to light touch and can flex and extend his toes and dorsiflex and plantarflex his left ankle. I removed the Hemovac drain.   LABS  Results for orders placed or performed during the hospital encounter of 04/16/16 (from the past 24 hour(s))  CBC     Status: Abnormal   Collection Time: 04/16/16  2:18 PM  Result Value Ref Range   WBC 9.1 3.8 - 10.6 K/uL   RBC 4.37 (L) 4.40 - 5.90 MIL/uL   Hemoglobin 13.2 13.0 - 18.0 g/dL   HCT 38.8 (L) 40.0 - 52.0 %   MCV 88.8 80.0 - 100.0 fL   MCH 30.2 26.0 - 34.0 pg   MCHC 34.0 32.0 - 36.0 g/dL   RDW 12.7 11.5 - 14.5 %   Platelets 172 150 - 440 K/uL  Creatinine, serum     Status: None   Collection Time: 04/16/16  2:18 PM  Result Value Ref Range   Creatinine, Ser 0.78 0.61 - 1.24 mg/dL   GFR calc non Af Amer >60 >60 mL/min   GFR calc Af Amer >60 >60 mL/min  CBC     Status: Abnormal   Collection Time: 04/17/16  4:28 AM  Result Value Ref Range   WBC 9.5 3.8 - 10.6 K/uL   RBC 3.97 (L) 4.40 - 5.90 MIL/uL   Hemoglobin 12.1 (L) 13.0 - 18.0 g/dL   HCT 34.9 (L) 40.0 - 52.0 %   MCV 87.9 80.0 - 100.0 fL   MCH 30.6 26.0 - 34.0 pg   MCHC 34.8 32.0 - 36.0 g/dL   RDW 13.1 11.5 - 14.5 %   Platelets 164 150 - 440 K/uL  Basic metabolic panel     Status: Abnormal   Collection Time: 04/17/16  4:28 AM  Result Value Ref Range   Sodium 139 135 - 145 mmol/L   Potassium 3.8 3.5 - 5.1 mmol/L   Chloride 106 101 - 111 mmol/L   CO2 26 22 - 32 mmol/L   Glucose, Bld 119 (H) 65 - 99 mg/dL   BUN 14 6 - 20 mg/dL   Creatinine, Ser 0.89 0.61 - 1.24 mg/dL   Calcium 8.6 (L) 8.9 - 10.3 mg/dL   GFR  calc non Af Amer >60 >60 mL/min   GFR calc Af Amer >60 >60 mL/min   Anion gap 7 5 - 15    Dg Knee Left Port  04/16/2016  CLINICAL DATA:  Status post total knee replacement EXAM: PORTABLE LEFT KNEE - 1-2 VIEW COMPARISON:  None. FINDINGS: Frontal and lateral views were obtained. The patient is status post total knee replacement with femoral and tibial prosthetic components appearing well-seated. No fracture or dislocation. Drains are noted in the knee joint region. There are overlying skin staples as well as mild soft tissue air, expected postoperative findings. IMPRESSION: Femoral and tibial prosthetic components appear well seated. No acute fracture or dislocation. Electronically Signed   By: Lowella Grip III M.D.   On: 04/16/2016 13:16    Assessment/Plan: 1 Day Post-Op   Active Problems:   Status post total left knee replacement  Patient is doing well postop. I'm pleased to see his pain is improving. He is making good progress postop. Complete 24 hours of postop antibiotics. Continue Lovenox for DVT prophylaxis along with TED stockings and foot pumps. Continue physical therapy for knee range of motion gait training and lower extremity  strengthening. He may be weightbearing as tolerated on the left lower extremity.    Thornton Park , MD 04/17/2016, 12:11 PM

## 2016-04-17 NOTE — Evaluation (Signed)
Occupational Therapy Evaluation Patient Details Name: EDILSON GAN MRN: [id number] DOB: November 22, 1946 Today's Date: 04/17/2016    History of Present Illness Pt. is a 70y.o. male who was admitted for a LTKR.   Clinical Impression   Pt. Is a 70 y.o. Male who was admitted for a Left TKR. Pt presents with limited ROM, Pain,lethargy, weakness, and impaired functional mobility which hinder his/her ability to complete ADL and IADL tasks. Pt. could benefit from skilled OT services to review A/E use for LE ADLs, to review necessary home modifications, and to improve functional mobility for ADL/IADLs in order to work towards regaining Independence with ADL/IADLs.     Follow Up Recommendations  No OT follow up    Equipment Recommendations       Recommendations for Other Services PT consult     Precautions / Restrictions        Mobility Bed Mobility                  Transfers    Pt. Up in chair, lethargic                    Balance                                            ADL Overall ADL's : Needs assistance/impaired                                       General ADL Comments: Pt. requires extensive assist for LE ADLs secondary to pain and lethargy.     Vision     Perception     Praxis      Pertinent Vitals/Pain Pain Assessment: 0-10 Pain Score: 5  Pain Location: Left knee     Hand Dominance     Extremity/Trunk Assessment Upper Extremity Assessment Upper Extremity Assessment: Overall WFL for tasks assessed           Communication Communication Communication: No difficulties   Cognition Arousal/Alertness: Lethargic                         General Comments       Exercises       Shoulder Instructions      Home Living Family/patient expects to be discharged to:: Private residence Living Arrangements: Spouse/significant other Available Help at Discharge: Family Type of Home:  (Resides in a  one storey flat.) Home Access: Stairs to enter     Home Layout: One level     Bathroom Shower/Tub: Occupational psychologist: Standard     Home Equipment: Environmental consultant - 4 wheels          Prior Functioning/Environment Level of Independence: Independent             OT Diagnosis: Generalized weakness;Acute pain   OT Problem List: Decreased range of motion;Decreased knowledge of use of DME or AE   OT Treatment/Interventions:      OT Goals(Current goals can be found in the care plan section) Acute Rehab OT Goals OT Goal Formulation: Patient unable to participate in goal setting  OT Frequency:     Barriers to D/C:            Co-evaluation  End of Session    Activity Tolerance: Patient tolerated treatment well Patient left: in chair;with call bell/phone within reach;with chair alarm set;with family/visitor present   Time: 1125-1140 OT Time Calculation (min): 15 min Charges:  OT General Charges $OT Visit: 1 Procedure OT Evaluation $OT Eval Low Complexity: 1 Procedure G-Codes:    Harrel Carina, MS, OTR/L Harrel Carina 04/17/2016, 11:49 AM

## 2016-04-17 NOTE — Plan of Care (Signed)
Problem: Safety: Goal: Ability to remain free from injury will improve Outcome: Progressing Pt free from falls this shift.  Problem: Pain Managment: Goal: General experience of comfort will improve Outcome: Progressing Started shift with pain 10/10 pain now rating 4/10. Tolerating oral pain medication without difficulty.

## 2016-04-17 NOTE — Care Management Note (Addendum)
Case Management Note  Patient Details  Name: Jesus Blevins MRN: [id number] Date of Birth: 10-31-1946  Subjective/Objective:                  Met with patient and his wife to discuss discharge planning. They may have a rolling walker and bedside commode that they can borrow- they will let this RNCM know. PT is recommending HHPT with 24 hour assistance today. Patient has follow up with outpatient PT on 04/23/16 at 3:[facility]PM [facility] and patient is aware. He uses [facility] for Rx  [phone].  Action/Plan: Message left for Dr. Harden Mo office to confirm this RNCM order to call in Lovenox 85m BID for [redacted] weeks along with Rx for rolling walker and bedside commode in case patient needs at discharge. RNCM will continue to follow.   Expected Discharge Date:                  Expected Discharge Plan:     In-House Referral:     Discharge planning Services  CM Consult  Post Acute Care Choice:  Durable Medical Equipment, Home Health Choice offered to:  Patient, Spouse  DME Arranged:  Walker rolling, Bedside commode [place]ME Agency:  [facility]    [facility]:     Status of Service:  In process, will continue to follow  Medicare Important Message Given:    Date Medicare IM Given:    Medicare IM give by:    Date Additional Medicare IM Given:    Additional Medicare Important Message give by:     If discussed at [place], dates discussed:    Additional Comments: 04/17/16 12:52 PM Telephone order per Dr. KThornton Park Lovenox 483minjection daily for 4 weeks (#28) no refills. This order was [facility] in [phone] going to replace patient's rollator with a front-wheeled walker per wife and she was also able to obtain a 3 in 1 toilet. No current RNCM DME needs.   Lovenox $64.00. Patient/wife informed.   AnMarshell GarfinkelRN 04/17/2016, 9:50 AM

## 2016-04-18 LAB — CBC
HEMATOCRIT: 30.7 % — AB (ref 40.0–52.0)
HEMOGLOBIN: 11 g/dL — AB (ref 13.0–18.0)
MCH: 31.6 pg (ref 26.0–34.0)
MCHC: 35.7 g/dL (ref 32.0–36.0)
MCV: 88.3 fL (ref 80.0–100.0)
Platelets: 135 10*3/uL — ABNORMAL LOW (ref 150–440)
RBC: 3.47 MIL/uL — ABNORMAL LOW (ref 4.40–5.90)
RDW: 13 % (ref 11.5–14.5)
WBC: 10.2 10*3/uL (ref 3.8–10.6)

## 2016-04-18 MED ORDER — AMOXICILLIN-POT CLAVULANATE 875-125 MG PO TABS: 1.0000 | ORAL_TABLET | Freq: Two times a day (BID) | ORAL | Status: DC

## 2016-04-18 MED ORDER — CLINDAMYCIN HCL 150 MG PO CAPS
300.0000 mg | ORAL_CAPSULE | Freq: Three times a day (TID) | ORAL | Status: DC
  Administered 2016-04-18: 300 mg via ORAL
  Filled 2016-04-18: qty 2

## 2016-04-18 MED ORDER — CLINDAMYCIN HCL 300 MG PO CAPS: 300.0000 mg | ORAL_CAPSULE | Freq: Three times a day (TID) | ORAL | Status: DC

## 2016-04-18 MED ORDER — ENOXAPARIN SODIUM 40 MG/0.4ML ~~LOC~~ SOLN: 40.0000 mg | SUBCUTANEOUS | Status: DC

## 2016-04-18 MED ORDER — OXYCODONE HCL 10 MG PO TABS: 10.0000 mg | ORAL_TABLET | ORAL | Status: DC | PRN

## 2016-04-18 NOTE — Progress Notes (Signed)
OT Cancellation Note  Patient Details Name: Jesus Blevins MRN: [id number] DOB: 25-May-1946   Cancelled Treatment:    Reason Eval/Treat Not Completed: Other (comment) (Per pt. and wife: No further ADL or A/E needs warranting continued OT at this time. OT services to be discharged at this level of care.Marland Kitchen)   Harrel Carina, MS, OTR/L   Harrel Carina 04/18/2016, 12:57 PM

## 2016-04-18 NOTE — Care Management (Signed)
Notified by Dr. Mack Guise that patient will need HHPT. I spoke with patient and he would like to use [facility]. They will start care with patient tomorrow. Patient Cell phone number was given to Abby with [facility]. No further RNCM needs. Case closed.

## 2016-04-18 NOTE — Discharge Summary (Signed)
Physician Discharge Summary  Patient ID: Jesus Blevins MRN: [id number] DOB/AGE: 1945-12-12 70 y.o.  Admit date: 04/16/2016 Discharge date: 04/18/2016  Admission Diagnoses:  Unilateral primary osteoarthritis, left knee   Discharge Diagnoses:  Unilateral primary osteoarthritis, left knee Active Problems:   Status post total left knee replacement   Past Medical History  Diagnosis Date  . Hypertension   . GERD (gastroesophageal reflux disease)   . Allergic rhinitis   . Restless leg   . C. difficile colitis   . Hypercalcemia   . Sinusitis   . Depression   . Anxiety   . Osteoarthritis   . Anemia   . Coronary artery disease     Surgeries: Procedure(s): TOTAL KNEE ARTHROPLASTY on 04/16/2016   Consultants (if any):    Discharged Condition: Improved  Hospital Course: RUSTON RAPOZO is an 70 y.o. male who was admitted 04/16/2016 with a diagnosis of  Unilateral primary osteoarthritis, left knee  and went to the operating room on 04/16/2016 and underwent an uncomplicated left total knee arthroplasty.Marland Kitchen  He was admitted to the orthopedic floor postoperatively.  He was given perioperative antibiotics:  Anti-infectives    Start     Dose/Rate Route Frequency Ordered Stop   04/16/16 1400  ceFAZolin (ANCEF) IVPB 2g/100 mL premix     2 g 200 mL/hr over 30 Minutes Intravenous Every 6 hours 04/16/16 1353 04/16/16 2009   04/16/16 0649  ceFAZolin (ANCEF) 2-4 GM/100ML-% IVPB    Comments:  STOLLEY, LORI: cabinet override      04/16/16 0649 04/16/16 1859   04/16/16 0232  ceFAZolin (ANCEF) IVPB 2g/100 mL premix     2 g 200 mL/hr over 30 Minutes Intravenous On call to O.R. 04/16/16 [id number] 04/16/16 0915    .  He was given sequential compression devices, early ambulation, and Lovenox for DVT prophylaxis.  Patient received 24 hours of postop antibiotics. He had daily labs drawn which demonstrated a stable hematocrit and hemoglobin throughout his hospital stay. He received physical and occupational  therapy beginning on postop day #1. His Foley catheter and Hemovac drain removed on postoperative day 1. Patient made expected progress with physical therapy postoperatively. Given his clinical improvement he is prepared for discharge home on postop day #2.  He benefited maximally from the hospital stay and there were no complications.    Recent vital signs:  Filed Vitals:   04/18/16 0347 04/18/16 0817  BP: 113/58 118/55  Pulse: 81 74  Temp: 99.9 F (37.7 C) 98.4 F (36.9 C)  Resp: 16 17    Recent laboratory studies:  Lab Results  Component Value Date   HGB 11.0* 04/18/2016   HGB 12.1* 04/17/2016   HGB 13.2 04/16/2016   Lab Results  Component Value Date   WBC 10.2 04/18/2016   PLT 135* 04/18/2016   Lab Results  Component Value Date   INR 1.01 03/26/2016   Lab Results  Component Value Date   NA 139 04/17/2016   K 3.8 04/17/2016   CL 106 04/17/2016   CO2 26 04/17/2016   BUN 14 04/17/2016   CREATININE 0.89 04/17/2016   GLUCOSE 119* 04/17/2016    Discharge Medications:     Medication List    STOP taking these medications        naproxen sodium 220 MG tablet  Commonly known as:  ANAPROX      TAKE these medications        ALPRAZolam 0.5 MG tablet  Commonly known as:  XANAX  Take  0.5 mg by mouth 2 (two) times daily as needed for anxiety.     aspirin 81 MG tablet  Take 81 mg by mouth daily.     atorvastatin 40 MG tablet  Commonly known as:  LIPITOR  Take 40 mg by mouth at bedtime.     azelastine 0.1 % nasal spray  Commonly known as:  ASTELIN  Place 1 spray into both nostrils 2 (two) times daily. Use in each nostril as directed     calcium-vitamin D 500-200 MG-UNIT tablet  Commonly known as:  OSCAL WITH D  Take 1 tablet by mouth once.     enoxaparin 40 MG/0.4ML injection  Commonly known as:  LOVENOX  Inject 0.4 mLs (40 mg total) into the skin daily.     fexofenadine 180 MG tablet  Commonly known as:  ALLEGRA  Take 180 mg by mouth daily.      fluticasone 50 MCG/ACT nasal spray  Commonly known as:  FLONASE  Place 1 spray into both nostrils daily.     glucosamine-chondroitin 500-400 MG tablet  Take 2 tablets by mouth once.     losartan 50 MG tablet  Commonly known as:  COZAAR  Take 50 mg by mouth daily.     montelukast 10 MG tablet  Commonly known as:  SINGULAIR  Take 10 mg by mouth at bedtime.     MULTI GINSENG 1000 MG Caps  Take 1,000 mg by mouth once.     multivitamin capsule  Take 1 capsule by mouth daily.     omega-3 acid ethyl esters 1 g capsule  Commonly known as:  LOVAZA  Take 2 capsules by mouth once.     omeprazole 40 MG capsule  Commonly known as:  PRILOSEC  Take 40 mg by mouth daily.     Oxycodone HCl 10 MG Tabs  Take 1 tablet (10 mg total) by mouth every 4 (four) hours as needed for breakthrough pain.     polyethylene glycol packet  Commonly known as:  MIRALAX / GLYCOLAX  Take 17 g by mouth daily as needed.     sertraline 50 MG tablet  Commonly known as:  ZOLOFT  Take 50 mg by mouth at bedtime.        Diagnostic Studies: Dg Knee Left Port  04/16/2016  CLINICAL DATA:  Status post total knee replacement EXAM: PORTABLE LEFT KNEE - 1-2 VIEW COMPARISON:  None. FINDINGS: Frontal and lateral views were obtained. The patient is status post total knee replacement with femoral and tibial prosthetic components appearing well-seated. No fracture or dislocation. Drains are noted in the knee joint region. There are overlying skin staples as well as mild soft tissue air, expected postoperative findings. IMPRESSION: Femoral and tibial prosthetic components appear well seated. No acute fracture or dislocation. Electronically Signed   By: Lowella Grip III M.D.   On: 04/16/2016 13:16    Disposition: 01-Home or Self Care      Discharge Instructions    Call MD / Call 911    Complete by:  As directed   If you experience chest pain or shortness of breath, CALL 911 and be transported to the hospital emergency  room.  If you develope a fever above 101 F, pus (white drainage) or increased drainage or redness at the wound, or calf pain, call your surgeon's office.     Constipation Prevention    Complete by:  As directed   Drink plenty of fluids.  Prune juice may be helpful.  You may use a stool softener, such as Colace (over the counter) 100 mg twice a day.  Use MiraLax (over the counter) for constipation as needed.     Diet - low sodium heart healthy    Complete by:  As directed      Discharge instructions    Complete by:  As directed   Continue weightbearing as tolerated on the left lower extremity. Continue to use TED stockings until follow-up. Patient may remove them at night for sleep. Elevate the left lower extremity whenever possible. Continue to use knee immobilizer at night or when lying in bed or when elevating the operative leg. The patient may remove the knee immobilizer to perform exercises or sit in a chair. Continue using the TENS unit and Polar Care for comfort. Keep incision clean and dry. Cover the left knee incision during showers with a plastic bag or Saran wrap. Take lovenox 40mg  subcutaneously daily for blood clot prevention. Continue to work on knee range of motion exercises at home as instructed by physical therapy. Continue to use a walker for assistance with ambulation until follow-up.     Do not put a pillow under the knee. Place it under the heel.    Complete by:  As directed      Driving restrictions    Complete by:  As directed   No driving until off narcotic pain mediation.     Increase activity slowly as tolerated    Complete by:  As directed      Lifting restrictions    Complete by:  As directed   No lifting for 12-16 weeks     TED hose    Complete by:  As directed   Use stockings (TED hose) for 2 weeks on both leg(s).  You may remove them at night for sleeping.              Signed: Thornton Park ,MD 04/18/2016, 12:54 PM

## 2016-04-18 NOTE — Progress Notes (Signed)
DISCHARGE NOTE:  Pt given discharge instrcutions and prescriptions. Pt verbalized understanding. Pt wheeled to car by staff.  

## 2016-04-18 NOTE — Care Management Important Message (Signed)
Important Message  Patient Details  Name: Jesus Blevins MRN: [id number] Date of Birth: 07-10-1946   Medicare Important Message Given:  Yes    Juliann Pulse A Milad Bublitz 04/18/2016, 11:02 AM

## 2016-04-18 NOTE — Progress Notes (Signed)
  Subjective:  Patient mentions he is having some sinus congestion.  He is worried that he may be developing a sinus infection. Patient has a history of chronic sinus infections.   Objective:   VITALS:   Filed Vitals:   04/17/16 1645 04/17/16 1940 04/18/16 0347 04/18/16 0817  BP: 116/65 124/56 113/58 118/55  Pulse: 76 75 81 74  Temp: 98.7 F (37.1 C) 98.1 F (36.7 C) 99.9 F (37.7 C) 98.4 F (36.9 C)  TempSrc: Oral Oral Oral Oral  Resp: 18  16 17   Height:      Weight:      SpO2: 94% 93% 94% 93%    LABS  Results for orders placed or performed during the hospital encounter of 04/16/16 (from the past 24 hour(s))  CBC     Status: Abnormal   Collection Time: 04/18/16  4:12 AM  Result Value Ref Range   WBC 10.2 3.8 - 10.6 K/uL   RBC 3.47 (L) 4.40 - 5.90 MIL/uL   Hemoglobin 11.0 (L) 13.0 - 18.0 g/dL   HCT 30.7 (L) 40.0 - 52.0 %   MCV 88.3 80.0 - 100.0 fL   MCH 31.6 26.0 - 34.0 pg   MCHC 35.7 32.0 - 36.0 g/dL   RDW 13.0 11.5 - 14.5 %   Platelets 135 (L) 150 - 440 K/uL    Dg Knee Left Port  04/16/2016  CLINICAL DATA:  Status post total knee replacement EXAM: PORTABLE LEFT KNEE - 1-2 VIEW COMPARISON:  None. FINDINGS: Frontal and lateral views were obtained. The patient is status post total knee replacement with femoral and tibial prosthetic components appearing well-seated. No fracture or dislocation. Drains are noted in the knee joint region. There are overlying skin staples as well as mild soft tissue air, expected postoperative findings. IMPRESSION: Femoral and tibial prosthetic components appear well seated. No acute fracture or dislocation. Electronically Signed   By: Lowella Grip III M.D.   On: 04/16/2016 13:16    Assessment/Plan: 2 Days Post-Op   Active Problems:   Status post total left knee replacement  I am going to start the patient on Augmentin 875 mg twice a day to empirically treat for a sinus infection to avoid possible hematogenous spread to his knee. He  will continue to take Augmentin upon discharge for 10-14 days. I have left a message for Dr. Anda Latina to discuss this case. Dr. Tami Ribas follows this patient for his sinus infections. I will coordinate with Dr. Tami Ribas regarding follow-up for patient's sinuses.   Thornton Park , MD 04/18/2016, 1:13 PM

## 2016-04-18 NOTE — Progress Notes (Signed)
Subjective:  Postoperative day #2 status post left total knee arthroplasty. Patient reports pain as moderate.  Patient's wife is at the bedside. Patient is up out of bed to a chair eating lunch. He is tolerating by mouth diet. He has had a bowel movement 2. Patient denies chest pain, shortness of breath or abdominal pain. He has tolerated physical therapy and is achieving a flexion of 80 per the patient. He is currently out of bed with a knee immobilizer on his left knee and a towel roll under his left heel.  Objective:   VITALS:   Filed Vitals:   04/17/16 1645 04/17/16 1940 04/18/16 0347 04/18/16 0817  BP: 116/65 124/56 113/58 118/55  Pulse: 76 75 81 74  Temp: 98.7 F (37.1 C) 98.1 F (36.7 C) 99.9 F (37.7 C) 98.4 F (36.9 C)  TempSrc: Oral Oral Oral Oral  Resp: 18  16 17   Height:      Weight:      SpO2: 94% 93% 94% 93%    PHYSICAL EXAM:  Left lower extremity: Patient's dressing was changed by me. His incision is clean dry and intact. He has a small knee effusion without erythema. His leg and thigh compartments are soft and compressible. He has intact sensation light touch. He has no calf tenderness or lower leg edema.   He has palpable pedal pulses and intact sensation light touch. He can flex and extend his toes and dorsiflex and plantarflex his ankle.   LABS  Results for orders placed or performed during the hospital encounter of 04/16/16 (from the past 24 hour(s))  CBC     Status: Abnormal   Collection Time: 04/18/16  4:12 AM  Result Value Ref Range   WBC 10.2 3.8 - 10.6 K/uL   RBC 3.47 (L) 4.40 - 5.90 MIL/uL   Hemoglobin 11.0 (L) 13.0 - 18.0 g/dL   HCT 30.7 (L) 40.0 - 52.0 %   MCV 88.3 80.0 - 100.0 fL   MCH 31.6 26.0 - 34.0 pg   MCHC 35.7 32.0 - 36.0 g/dL   RDW 13.0 11.5 - 14.5 %   Platelets 135 (L) 150 - 440 K/uL    Dg Knee Left Port  04/16/2016  CLINICAL DATA:  Status post total knee replacement EXAM: PORTABLE LEFT KNEE - 1-2 VIEW COMPARISON:  None. FINDINGS:  Frontal and lateral views were obtained. The patient is status post total knee replacement with femoral and tibial prosthetic components appearing well-seated. No fracture or dislocation. Drains are noted in the knee joint region. There are overlying skin staples as well as mild soft tissue air, expected postoperative findings. IMPRESSION: Femoral and tibial prosthetic components appear well seated. No acute fracture or dislocation. Electronically Signed   By: Lowella Grip III M.D.   On: 04/16/2016 13:16    Assessment/Plan: 2 Days Post-Op   Active Problems:   Status post total left knee replacement  Patient is doing well postop. He is making progress physical therapy. His pain is better controlled today. His hemoglobin and hematocrit remained stable today. He has had a bowel movement. Patient is receiving Lovenox for DVT prophylaxis along with his TED stockings and foot pumps. Elected patient received physical therapy this afternoon. It is possible he is ready for discharge later today. I'm recommending he received home health PT through the weekend and then begin outpatient physical therapy next week as his pain improves. He will continue Lovenox 40 mg daily for DVT prophylaxis 4 weeks. He should continue wearing his TED  stockings for 2 weeks. Patient will be weightbearing as tolerated on the right lower extremity.    Thornton Park , MD 04/18/2016, 12:39 PM

## 2016-04-18 NOTE — Progress Notes (Signed)
Physical Therapy Treatment Patient Details Name: Jesus Blevins MRN: [id number] DOB: 1946-09-20 Today's Date: 04/18/2016    History of Present Illness Pt is a 70 y.o. 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  Pt with low HR and episode of hypotension post-op.  PMH includes htn, anxiety, depression, anemia, back surgery.    PT Comments    Pt able to ambulate around nursing loop with RW SBA to CGA with L KI; pt with decreased cadence but steady without loss of balance.  Pt's L knee ROM initially limited and needing gentle ROM/stretching to increase ROM during session (limited d/t pain).  Reviewed LE supine HEP and L knee stretching ex's with pt and pt's wife; both verbalized good understanding and pt demonstrated good understanding.  Also reviewed correct positioning and donning/doffing of L KI with pt's wife (pt's wife demonstrating good understanding).   Follow Up Recommendations  Supervision for mobility/OOB;Outpatient PT     Equipment Recommendations  Rolling walker with 5" wheels;3in1 (PT)    Recommendations for Other Services       Precautions / Restrictions Precautions Precautions: Fall Required Braces or Orthoses: Knee Immobilizer - Left Knee Immobilizer - Left: On except when in CPM Restrictions Weight Bearing Restrictions: Yes LLE Weight Bearing: Weight bearing as tolerated    Mobility  Bed Mobility Overal bed mobility: Needs Assistance Bed Mobility: Supine to Sit;Sit to Supine     Supine to sit: Min assist;HOB elevated Sit to supine: Min assist;HOB elevated   General bed mobility comments: assist for L LE in/out of bed (except pt able to perform with SBA with use of UE's on L KI)  Transfers Overall transfer level: Needs assistance Equipment used: Rolling walker (2 wheeled) Transfers: Sit to/from [facility] Sit to Stand: Min guard Stand pivot transfers: Min guard (toilet transfer)       General transfer comment: no vc's  required for hand and feet placement  Ambulation/Gait Ambulation/Gait assistance: Supervision;Min guard Ambulation Distance (Feet): 240 Feet Assistive device: Rolling walker (2 wheeled)   Gait velocity: decreased   General Gait Details: antalgic/decreased stance time L LE; initial vc's required for gait pattern and walker use but then no vc's required after about 50 feet   Stairs            Wheelchair Mobility    Modified Rankin (Stroke Patients Only)       Balance Overall balance assessment: Needs assistance Sitting-balance support: Bilateral upper extremity supported;Feet supported Sitting balance-Leahy Scale: Good     Standing balance support: Bilateral upper extremity supported (on RW) Standing balance-Leahy Scale: Good                      Cognition Arousal/Alertness: Awake/alert Behavior During Therapy: WFL for tasks assessed/performed Overall Cognitive Status: Within Functional Limits for tasks assessed                      Exercises Total Joint Exercises Ankle Circles/Pumps: AROM;Strengthening;Both;10 reps;Supine Quad Sets: AROM;Strengthening;Both;10 reps;Supine Short Arc Quad: Strengthening;Both;10 reps;Supine (AROM R; AAROM L) Heel Slides: Strengthening;Both;10 reps;Supine (AROM R; AAROM L) Hip ABduction/ADduction: Strengthening;Both;10 reps;Supine (AROM R; AAROM L) Straight Leg Raises: Strengthening;Both;10 reps;Supine (AROM R; AAROM L) Goniometric ROM: L knee extension 10 degrees short of neutral semi-supine in bed; L knee flexion 80 degrees flexion in sitting (x3 PROM repetitions to get to 80 degrees)    General Comments General comments (skin integrity, edema, etc.): L knee dressings and polar care  in place  Nursing cleared pt for participation in physical therapy.  Pt agreeable to PT session.      Pertinent Vitals/Pain Pain Assessment: 0-10 Pain Score: 5  Pain Location: L knee Pain Descriptors / Indicators:  Sore;Tender;Operative site guarding Pain Intervention(s): Limited activity within patient's tolerance;Monitored during session;Premedicated before session;Repositioned;Ice applied    Home Living                      Prior Function            PT Goals (current goals can now be found in the care plan section) Acute Rehab PT Goals Patient Stated Goal: to go home PT Goal Formulation: With patient/family Time For Goal Achievement: 05/01/16 Potential to Achieve Goals: Good Progress towards PT goals: Progressing toward goals    Frequency  BID    PT Plan Current plan remains appropriate    Co-evaluation             End of Session Equipment Utilized During Treatment: Gait belt;Left knee immobilizer Activity Tolerance: Patient tolerated treatment well Patient left: in bed;with call bell/phone within reach;with bed alarm set;with family/visitor present;with SCD's reapplied (B heels elevated via towel rolls; polar care in place and activated; L KI in place)     TimeHS:[id number] PT Time Calculation (min) (ACUTE ONLY): 60 min  Charges:  $Gait Training: 8-22 mins $Therapeutic Exercise: 23-37 mins $Therapeutic Activity: 8-22 mins                    G CodesLeitha Bleak 03-May-2016, 11:50 AM Leitha Bleak, [place]

## 2016-04-18 NOTE — Evaluation (Addendum)
Physical Therapy Progress Notes Patient Details Name: Jesus Blevins MRN: [id number] DOB: 03-01-1946 Today's Date: 04/18/2016   History of Present Illness  Pt is a 70 y.o. 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  Pt with low HR and episode of hypotension post-op.  PMH includes htn, anxiety, depression, anemia, back surgery.  Clinical Impression  Pt SBA with transfers and ambulation with RW and L KI.  No loss of balance noted during session.  Pt reporting wanting to discharge home today so he can get a good nights sleep in his own bed.  Reviewed car transfer technique, assist levels required for safe discharge home, and LE sitting HEP with pt and pt's wife; pt and pt's wife verbalizing good understanding and pt demonstrating good understanding.  L knee sitting flexion ex's limited d/t L knee pain.  Pt has 24/7 assist from his wife at home and pt's wife demonstrates appropriate ability to safely assist pt at home with functional mobility.  D/t pt having appropriate assist based on current functional level, pt appears safe to discharge home with this assist.  Recommend HHPT d/t pt with limited ROM in L knee (complicated d/t pain) and impaired L LE strength (pt unable to perform SLR L LE independently).    Follow Up Recommendations Supervision for mobility/OOB;Home health PT (CM notified of HHPT recommendation)    Equipment Recommendations  Rolling walker with 5" wheels;3in1 (PT)    Recommendations for Other Services       Precautions / Restrictions Precautions Precautions: Fall Required Braces or Orthoses: Knee Immobilizer - Left Knee Immobilizer - Left: On except when in CPM Restrictions Weight Bearing Restrictions: Yes LLE Weight Bearing: Weight bearing as tolerated      Mobility  Bed Mobility               General bed mobility comments: deferred d/t pt sitting in chair beginning and end of session  Transfers Overall transfer level: Needs assistance Equipment  used: Rolling walker (2 wheeled) Transfers: Sit to/from Stand Sit to Stand: Supervision         General transfer comment: no vc's required for hand and feet placement; steady without loss of balance  Ambulation/Gait Ambulation/Gait assistance: Supervision Ambulation Distance (Feet): 130 Feet Assistive device: Rolling walker (2 wheeled)   Gait velocity: mildly decreased   General Gait Details: antalgic/decreased stance time L LE; step through pattern  Stairs            Wheelchair Mobility    Modified Rankin (Stroke Patients Only)       Balance Overall balance assessment: Needs assistance Sitting-balance support: No upper extremity supported;Feet supported Sitting balance-Leahy Scale: Normal     Standing balance support: Bilateral upper extremity supported (on RW) Standing balance-Leahy Scale: Good                               Pertinent Vitals/Pain Pain Assessment: 0-10 Pain Score: 5  Pain Location: L knee Pain Descriptors / Indicators: Sore;Tender;Operative site guarding Pain Intervention(s): Limited activity within patient's tolerance;Monitored during session;Premedicated before session;Repositioned;Ice applied    Home Living                        Prior Function                 Hand Dominance        Extremity/Trunk Assessment  Communication      Cognition Arousal/Alertness: Awake/alert Behavior During Therapy: WFL for tasks assessed/performed Overall Cognitive Status: Within Functional Limits for tasks assessed                      General Comments General comments (skin integrity, edema, etc.): L knee dressings and polar care in place; pt sitting in bedside chair  [name] present entire session.    Exercises Total Joint Exercises Long Arc Quad: Strengthening;Both;10 reps;Seated (AROM R; AAROM L) Knee Flexion: AROM;Strengthening;Left;10 reps (towel under pt's foot to  allow pt's foot to slide easily on floor; limited ROM d/t pain)      Assessment/Plan    PT Assessment    PT Diagnosis     PT Problem List    PT Treatment Interventions     PT Goals (Current goals can be found in the Care Plan section) Acute Rehab PT Goals Patient Stated Goal: to go home PT Goal Formulation: With patient/family Time For Goal Achievement: 05/01/16 Potential to Achieve Goals: Good    Frequency BID   Barriers to discharge        Co-evaluation               End of Session Equipment Utilized During Treatment: Gait belt;Left knee immobilizer Activity Tolerance: Patient tolerated treatment well Patient left: in chair;with call bell/phone within reach;with family/visitor present (B heels elevated via towel rolls; polar care in place and activated) Nurse Communication: Mobility status         Time: 1452-1530 PT Time Calculation (min) (ACUTE ONLY): 38 min   Charges:     PT Treatments $Gait Training: 8-22 mins $Therapeutic Exercise: 8-22 mins $Therapeutic Activity: 8-22 mins   PT G CodesLeitha Bleak 05/12/16, 4:41 PM Leitha Bleak, [place]

## 2016-12-30 IMAGING — DX DG KNEE 1-2V PORT*L*
2 series · 2 of 2 positions shown · non-contrast
Comparison: None.

CLINICAL DATA: Status post total knee replacement

EXAM:
PORTABLE LEFT KNEE - 1-2 VIEW

[knee ap]
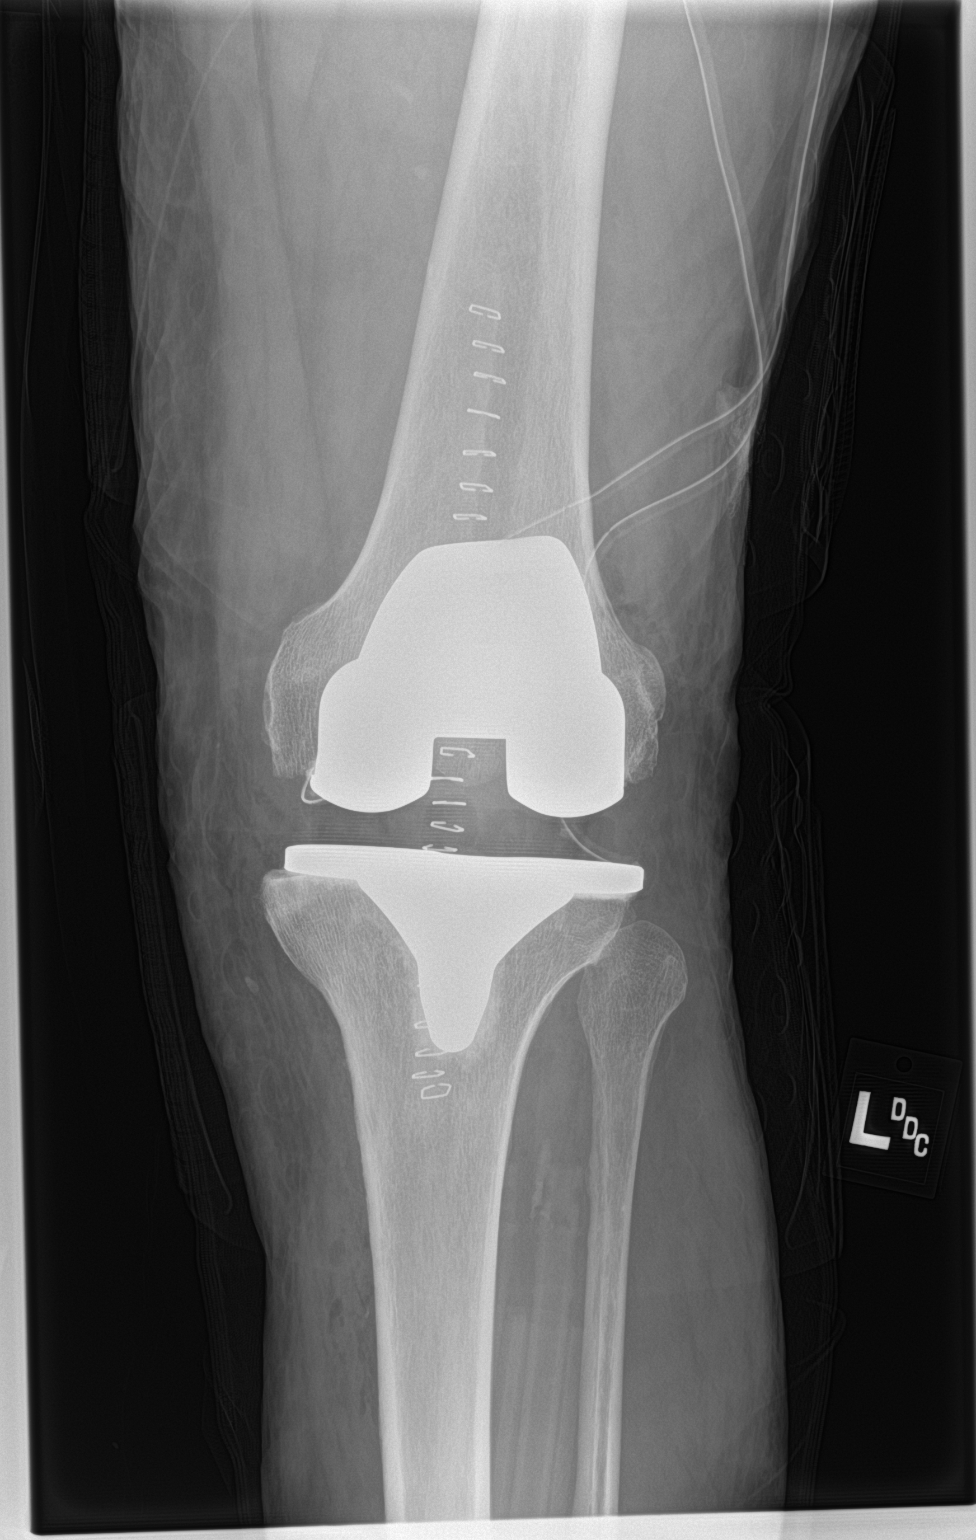

[knee lat]
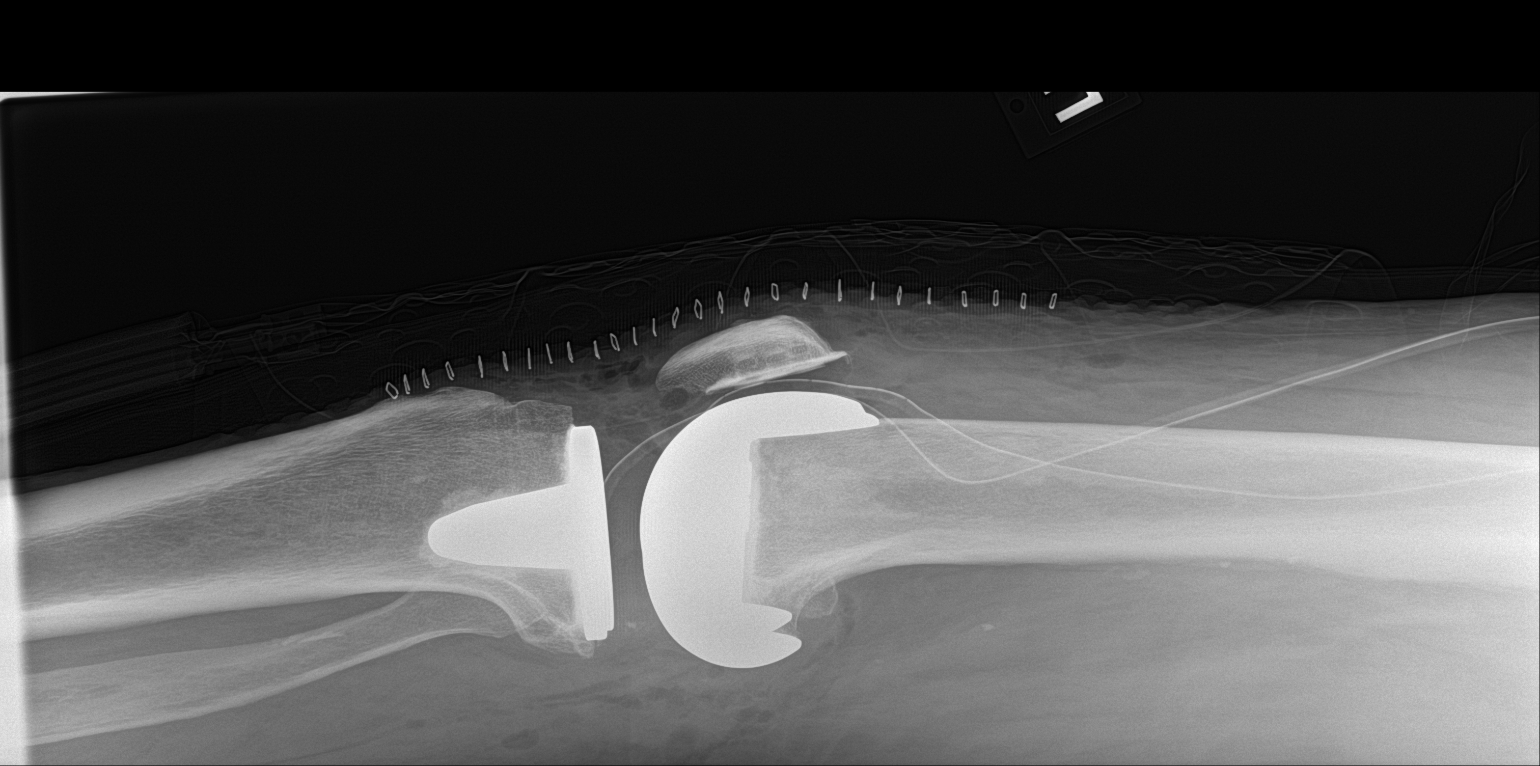

[2 of 2 positions shown; findings below may reference images not displayed]

FINDINGS: Frontal and lateral views were obtained. The patient is status post
total knee replacement with femoral and tibial prosthetic components
appearing well-seated. No fracture or dislocation. Drains are noted
in the knee joint region. There are overlying skin staples as well
as mild soft tissue air, expected postoperative findings.
IMPRESSION: Femoral and tibial prosthetic components appear well seated. No
acute fracture or dislocation.

## 2017-10-23 ENCOUNTER — Other Ambulatory Visit: Payer: Self-pay | Admitting: Orthopedic Surgery

## 2017-11-03 ENCOUNTER — Encounter
Admission: RE | Admit: 2017-11-03 | Discharge: 2017-11-03 | Disposition: A | Discharge status: Home / Self Care | Payer: Medicare Other | Source: Ambulatory Visit | Attending: Orthopedic Surgery | Admitting: Orthopedic Surgery

## 2017-11-03 ENCOUNTER — Other Ambulatory Visit: Payer: Self-pay

## 2017-11-03 DIAGNOSIS — F329 Major depressive disorder, single episode, unspecified: Secondary | ICD-10-CM | POA: Diagnosis not present

## 2017-11-03 DIAGNOSIS — Z01818 Encounter for other preprocedural examination: Secondary | ICD-10-CM

## 2017-11-03 DIAGNOSIS — Z882 Allergy status to sulfonamides status: Secondary | ICD-10-CM | POA: Diagnosis not present

## 2017-11-03 DIAGNOSIS — Z79899 Other long term (current) drug therapy: Secondary | ICD-10-CM | POA: Insufficient documentation

## 2017-11-03 DIAGNOSIS — Z888 Allergy status to other drugs, medicaments and biological substances status: Secondary | ICD-10-CM | POA: Diagnosis not present

## 2017-11-03 DIAGNOSIS — M65322 Trigger finger, left index finger: Secondary | ICD-10-CM

## 2017-11-03 DIAGNOSIS — Z7982 Long term (current) use of aspirin: Secondary | ICD-10-CM | POA: Insufficient documentation

## 2017-11-03 DIAGNOSIS — R001 Bradycardia, unspecified: Secondary | ICD-10-CM | POA: Insufficient documentation

## 2017-11-03 DIAGNOSIS — I10 Essential (primary) hypertension: Secondary | ICD-10-CM

## 2017-11-03 DIAGNOSIS — Z8249 Family history of ischemic heart disease and other diseases of the circulatory system: Secondary | ICD-10-CM | POA: Diagnosis not present

## 2017-11-03 DIAGNOSIS — F419 Anxiety disorder, unspecified: Secondary | ICD-10-CM | POA: Diagnosis not present

## 2017-11-03 DIAGNOSIS — I2510 Atherosclerotic heart disease of native coronary artery without angina pectoris: Secondary | ICD-10-CM | POA: Diagnosis not present

## 2017-11-03 DIAGNOSIS — K219 Gastro-esophageal reflux disease without esophagitis: Secondary | ICD-10-CM | POA: Diagnosis not present

## 2017-11-03 LAB — CBC WITH DIFFERENTIAL/PLATELET
BASOS ABS: 0 10*3/uL (ref 0–0.1)
BASOS PCT: 0 %
EOS ABS: 0.2 10*3/uL (ref 0–0.7)
Eosinophils Relative: 3 %
HCT: 39.7 % — ABNORMAL LOW (ref 40.0–52.0)
HEMOGLOBIN: 13.7 g/dL (ref 13.0–18.0)
Lymphocytes Relative: 42 %
Lymphs Abs: 3.2 10*3/uL (ref 1.0–3.6)
MCH: 30.6 pg (ref 26.0–34.0)
MCHC: 34.4 g/dL (ref 32.0–36.0)
MCV: 88.9 fL (ref 80.0–100.0)
Monocytes Absolute: 0.6 10*3/uL (ref 0.2–1.0)
Monocytes Relative: 8 %
NEUTROS ABS: 3.6 10*3/uL (ref 1.4–6.5)
NEUTROS PCT: 47 %
Platelets: 184 10*3/uL (ref 150–440)
RBC: 4.47 MIL/uL (ref 4.40–5.90)
RDW: 13.3 % (ref 11.5–14.5)
WBC: 7.8 10*3/uL (ref 3.8–10.6)

## 2017-11-03 LAB — BASIC METABOLIC PANEL
ANION GAP: 8 (ref 5–15)
BUN: 18 mg/dL (ref 6–20)
CALCIUM: 9.6 mg/dL (ref 8.9–10.3)
CHLORIDE: 104 mmol/L (ref 101–111)
CO2: 25 mmol/L (ref 22–32)
CREATININE: 0.82 mg/dL (ref 0.61–1.24)
GFR calc non Af Amer: >60 mL/min (ref >60)
Glucose, Bld: 92 mg/dL (ref 65–99)
Potassium: 4 mmol/L (ref 3.5–5.1)
SODIUM: 137 mmol/L (ref 135–145)

## 2017-11-03 LAB — PROTIME-INR
INR: 0.96
PROTHROMBIN TIME: 12.7 s (ref 11.4–15.2)

## 2017-11-03 LAB — APTT: aPTT: 32 seconds (ref 24–36)

## 2017-11-03 NOTE — Patient Instructions (Signed)
Your procedure is scheduled on: Thurs. 11/06/17 Report to Day Surgery. To find out your arrival time please call [phone] between 1PM - 3PM on Wed. 11/05/17.  Remember: Instructions that are not followed completely may result in serious medical risk, up to and including death, or upon the discretion of your surgeon and anesthesiologist your surgery may need to be rescheduled.     _X__ 1. Do not eat food after midnight the night before your procedure.                 No gum chewing or hard candies. You may drink clear liquids up to 2 hours                 before you are scheduled to arrive for your surgery- DO not drink clear                 liquids within 2 hours of the start of your surgery.                 Clear Liquids include:  water, apple juice without pulp, clear carbohydrate                 drink such as Clearfast of Gartorade, Black Coffee or Tea (Do not add                 anything to coffee or tea).     ___ 2.  No Alcohol for 24 hours before or after surgery.   ___ 3.  Do Not Smoke or use e-cigarettes For 24 Hours Prior to Your Surgery.                 Do not use any chewable tobacco products for at least 6 hours prior to                 surgery.  ____  4.  Bring all medications with you on the day of surgery if instructed.   _x___  5.  Notify your doctor if there is any change in your medical condition      (cold, fever, infections).     Do not wear jewelry, make-up, hairpins, clips or nail polish. Do not wear lotions, powders, or perfumes. You may wear deodorant. Do not shave 48 hours prior to surgery. Men may shave face and neck. Do not bring valuables to the hospital.    [facility] is not responsible for any belongings or valuables.  Contacts, dentures or bridgework may not be worn into surgery. Leave your suitcase in the car. After surgery it may be brought to your room. For patients admitted to the hospital, discharge time is determined  by your treatment team.   Patients discharged the day of surgery will not be allowed to drive home.   Please read over the following fact sheets that you were given:    _x___ Take these medicines the morning of surgery with A SIP OF WATER:    1. ALPRAZolam (XANAX) 0.5 MG tablet  2. fluticasone (FLONASE) 50 MCG/ACT nasal spray  3. losartan (COZAAR) 50 MG tablet  4.fexofenadine (ALLEGRA) 180 MG tablet  5.ranitidine (ZANTAC) 150 MG tablet the night before and morning of surgery  6.  ____ Fleet Enema (as directed)   __x__ Use CHG Soap as directed  ____ Use inhalers on the day of surgery  ____ Stop metformin 2 days prior to surgery    ____ Take 1/2 of usual insulin dose the night  before surgery. No insulin the morning          of surgery.   _x___ Stopped aspirin already  ____ Stop Anti-inflammatories on    __x__ Stop supplements until after surgery.  Flax Oil-Fish Oil-Borage Oil (FISH OIL-FLAX OIL-BORAGE OIL PO),Coenzyme Q10 (CO Q 10 PO),Biotin 10000 MCG TABS,B Complex-C (B-COMPLEX WITH VITAMIN C) tablet,Glucosamine-Chondroit-Vit C-Mn (GLUCOSAMINE CHONDR 1500 COMPLX) CAPSMisc Natural Products (LUTEIN 20) CAPS,  today  ____ Bring C-Pap to the hospital.

## 2017-11-05 MED ORDER — CEFAZOLIN SODIUM-DEXTROSE 2-4 GM/100ML-% IV SOLN
2.0000 g | INTRAVENOUS | Status: AC
  Administered 2017-11-06: 2 g via INTRAVENOUS

## 2017-11-06 ENCOUNTER — Encounter: Payer: Self-pay | Admitting: *Deleted

## 2017-11-06 ENCOUNTER — Ambulatory Visit
Admission: RE | Admit: 2017-11-06 | Discharge: 2017-11-06 | Disposition: A | Discharge status: Home / Self Care | Payer: Medicare Other | Source: Ambulatory Visit | Attending: Orthopedic Surgery | Admitting: Orthopedic Surgery

## 2017-11-06 ENCOUNTER — Encounter
Admission: RE | Disposition: A | Discharge status: Home / Self Care | Payer: Self-pay | Source: Ambulatory Visit | Attending: Orthopedic Surgery

## 2017-11-06 ENCOUNTER — Other Ambulatory Visit: Payer: Self-pay

## 2017-11-06 ENCOUNTER — Ambulatory Visit: Payer: Medicare Other | Admitting: Certified Registered"

## 2017-11-06 DIAGNOSIS — Z79899 Other long term (current) drug therapy: Secondary | ICD-10-CM | POA: Insufficient documentation

## 2017-11-06 DIAGNOSIS — M65322 Trigger finger, left index finger: Secondary | ICD-10-CM | POA: Diagnosis not present

## 2017-11-06 DIAGNOSIS — Z7982 Long term (current) use of aspirin: Secondary | ICD-10-CM | POA: Insufficient documentation

## 2017-11-06 DIAGNOSIS — Z888 Allergy status to other drugs, medicaments and biological substances status: Secondary | ICD-10-CM | POA: Insufficient documentation

## 2017-11-06 DIAGNOSIS — I10 Essential (primary) hypertension: Secondary | ICD-10-CM | POA: Insufficient documentation

## 2017-11-06 DIAGNOSIS — K219 Gastro-esophageal reflux disease without esophagitis: Secondary | ICD-10-CM | POA: Insufficient documentation

## 2017-11-06 DIAGNOSIS — I2510 Atherosclerotic heart disease of native coronary artery without angina pectoris: Secondary | ICD-10-CM | POA: Insufficient documentation

## 2017-11-06 DIAGNOSIS — F419 Anxiety disorder, unspecified: Secondary | ICD-10-CM | POA: Insufficient documentation

## 2017-11-06 DIAGNOSIS — Z882 Allergy status to sulfonamides status: Secondary | ICD-10-CM | POA: Insufficient documentation

## 2017-11-06 DIAGNOSIS — Z8249 Family history of ischemic heart disease and other diseases of the circulatory system: Secondary | ICD-10-CM | POA: Insufficient documentation

## 2017-11-06 DIAGNOSIS — F329 Major depressive disorder, single episode, unspecified: Secondary | ICD-10-CM | POA: Insufficient documentation

## 2017-11-06 HISTORY — PX: TRIGGER FINGER RELEASE: SHX641

## 2017-11-06 SURGERY — RELEASE, A1 PULLEY, FOR TRIGGER FINGER
Anesthesia: General | Site: Index Finger | Laterality: Left | Wound class: Clean

## 2017-11-06 MED ORDER — FENTANYL CITRATE (PF) 100 MCG/2ML IJ SOLN
INTRAMUSCULAR | Status: DC | PRN
Start: 2017-11-06 — End: 2017-11-06
  Administered 2017-11-06 (×2): 50 ug via INTRAVENOUS

## 2017-11-06 MED ORDER — OXYCODONE HCL 5 MG PO TABS: 5.0000 mg | ORAL_TABLET | ORAL | 0 refills | Status: DC | PRN

## 2017-11-06 MED ORDER — LIDOCAINE HCL (PF) 2 % IJ SOLN
INTRAMUSCULAR | Status: AC
  Filled 2017-11-06: qty 10

## 2017-11-06 MED ORDER — FENTANYL CITRATE (PF) 100 MCG/2ML IJ SOLN
INTRAMUSCULAR | Status: AC
  Filled 2017-11-06: qty 2

## 2017-11-06 MED ORDER — ONDANSETRON HCL 4 MG/2ML IJ SOLN
INTRAMUSCULAR | Status: DC | PRN
  Administered 2017-11-06: 4 mg via INTRAVENOUS

## 2017-11-06 MED ORDER — GLYCOPYRROLATE 0.2 MG/ML IJ SOLN
INTRAMUSCULAR | Status: AC
  Filled 2017-11-06: qty 1

## 2017-11-06 MED ORDER — LACTATED RINGERS IV SOLN
INTRAVENOUS | Status: DC
  Administered 2017-11-06: 11:00:00 via INTRAVENOUS

## 2017-11-06 MED ORDER — PROPOFOL 10 MG/ML IV BOLUS
INTRAVENOUS | Status: DC | PRN
  Administered 2017-11-06: 170 mg via INTRAVENOUS

## 2017-11-06 MED ORDER — EPHEDRINE SULFATE 50 MG/ML IJ SOLN
INTRAMUSCULAR | Status: DC | PRN
  Administered 2017-11-06: 10 mg via INTRAVENOUS

## 2017-11-06 MED ORDER — OXYCODONE HCL 5 MG PO TABS
5.0000 mg | ORAL_TABLET | Freq: Once | ORAL | Status: AC
  Administered 2017-11-06: 5 mg via ORAL

## 2017-11-06 MED ORDER — OXYCODONE HCL 5 MG PO TABS
ORAL_TABLET | ORAL | Status: AC
  Administered 2017-11-06: 5 mg via ORAL
  Filled 2017-11-06: qty 1

## 2017-11-06 MED ORDER — CEFAZOLIN SODIUM-DEXTROSE 2-4 GM/100ML-% IV SOLN
INTRAVENOUS | Status: AC
  Filled 2017-11-06: qty 100

## 2017-11-06 MED ORDER — LIDOCAINE HCL (CARDIAC) 20 MG/ML IV SOLN
INTRAVENOUS | Status: DC | PRN
  Administered 2017-11-06: 100 mg via INTRAVENOUS

## 2017-11-06 MED ORDER — EPHEDRINE SULFATE 50 MG/ML IJ SOLN
INTRAMUSCULAR | Status: AC
  Filled 2017-11-06: qty 1

## 2017-11-06 MED ORDER — NEOMYCIN-POLYMYXIN B GU 40-200000 IR SOLN
Status: DC | PRN
Start: 2017-11-06 — End: 2017-11-06
  Administered 2017-11-06: 2 mL

## 2017-11-06 MED ORDER — PROPOFOL 10 MG/ML IV BOLUS
INTRAVENOUS | Status: AC
  Filled 2017-11-06: qty 20

## 2017-11-06 MED ORDER — BUPIVACAINE HCL (PF) 0.25 % IJ SOLN
INTRAMUSCULAR | Status: AC
  Filled 2017-11-06: qty 30

## 2017-11-06 MED ORDER — NEOMYCIN-POLYMYXIN B GU 40-200000 IR SOLN
Status: AC
  Filled 2017-11-06: qty 2

## 2017-11-06 MED ORDER — CHLORHEXIDINE GLUCONATE CLOTH 2 % EX PADS: 6.0000 | MEDICATED_PAD | Freq: Once | CUTANEOUS | Status: DC

## 2017-11-06 MED ORDER — ONDANSETRON HCL 4 MG PO TABS: 4.0000 mg | ORAL_TABLET | Freq: Three times a day (TID) | ORAL | 0 refills | Status: DC | PRN

## 2017-11-06 MED ORDER — ONDANSETRON HCL 4 MG/2ML IJ SOLN
INTRAMUSCULAR | Status: AC
  Filled 2017-11-06: qty 2

## 2017-11-06 MED ORDER — GLYCOPYRROLATE 0.2 MG/ML IJ SOLN
INTRAMUSCULAR | Status: DC | PRN
  Administered 2017-11-06: 0.2 mg via INTRAVENOUS

## 2017-11-06 SURGICAL SUPPLY — 47 items
BANDAGE ELASTIC 2 LF NS (GAUZE/BANDAGES/DRESSINGS) ×3 IMPLANT
BANDAGE ELASTIC 4 LF NS (GAUZE/BANDAGES/DRESSINGS) ×6 IMPLANT
BLADE SURG MINI STRL (BLADE) ×3 IMPLANT
BNDG ESMARK 4X12 TAN STRL LF (GAUZE/BANDAGES/DRESSINGS) ×3 IMPLANT
CANISTER SUCT 1200ML W/VALVE (MISCELLANEOUS) ×3 IMPLANT
CLOSURE WOUND 1/2 X4 (GAUZE/BANDAGES/DRESSINGS) ×1
CORD BIP STRL DISP 12FT (MISCELLANEOUS) ×3 IMPLANT
CUFF TOURN 18 STER (MISCELLANEOUS) ×3 IMPLANT
DRAPE IMP U-DRAPE 54X76 (DRAPES) ×3 IMPLANT
DRAPE SURG 17X11 SM STRL (DRAPES) ×3 IMPLANT
DURAPREP 26ML APPLICATOR (WOUND CARE) ×3 IMPLANT
ELECT REM PT RETURN 9FT ADLT (ELECTROSURGICAL) ×3
ELECTRODE REM PT RTRN 9FT ADLT (ELECTROSURGICAL) ×1 IMPLANT
FORCEPS JEWEL BIP 4-3/4 STR (INSTRUMENTS) ×3 IMPLANT
GAUZE FLUFF 18X24 1PLY STRL (GAUZE/BANDAGES/DRESSINGS) ×3 IMPLANT
GAUZE PETRO XEROFOAM 1X8 (MISCELLANEOUS) ×3 IMPLANT
GAUZE SPONGE 4X4 12PLY STRL (GAUZE/BANDAGES/DRESSINGS) ×3 IMPLANT
GLOVE BIOGEL PI IND STRL 9 (GLOVE) ×1 IMPLANT
GLOVE BIOGEL PI INDICATOR 9 (GLOVE) ×2
GLOVE SURG 9.0 ORTHO LTXF (GLOVE) ×6 IMPLANT
GOWN STRL REUS TWL 2XL XL LVL4 (GOWN DISPOSABLE) ×3 IMPLANT
GOWN STRL REUS W/ TWL LRG LVL3 (GOWN DISPOSABLE) ×1 IMPLANT
GOWN STRL REUS W/TWL LRG LVL3 (GOWN DISPOSABLE) ×2
KIT RM TURNOVER STRD PROC AR (KITS) ×3 IMPLANT
NEEDLE FILTER BLUNT 18X 1/2SAF (NEEDLE) ×2
NEEDLE FILTER BLUNT 18X1 1/2 (NEEDLE) ×1 IMPLANT
NS IRRIG 500ML POUR BTL (IV SOLUTION) ×3 IMPLANT
PACK EXTREMITY ARMC (MISCELLANEOUS) ×3 IMPLANT
PAD CAST CTTN 4X4 STRL (SOFTGOODS) IMPLANT
PADDING CAST 2X4YD ST (MISCELLANEOUS) ×2
PADDING CAST 4IN STRL (MISCELLANEOUS)
PADDING CAST BLEND 2X4 STRL (MISCELLANEOUS) ×1 IMPLANT
PADDING CAST BLEND 4X4 STRL (MISCELLANEOUS) IMPLANT
PADDING CAST COTTON 4X4 STRL (SOFTGOODS)
SLING ARM LRG DEEP (SOFTGOODS) ×3 IMPLANT
SLING ARM M TX990204 (SOFTGOODS) IMPLANT
SPLINT CAST 1 STEP 3X12 (MISCELLANEOUS) IMPLANT
STOCKINETTE STRL 4IN 9604848 (GAUZE/BANDAGES/DRESSINGS) ×3 IMPLANT
STRIP CLOSURE SKIN 1/2X4 (GAUZE/BANDAGES/DRESSINGS) ×2 IMPLANT
SUT ETHILON 3-0 FS-10 30 BLK (SUTURE)
SUT ETHILON 4-0 (SUTURE)
SUT ETHILON 4-0 FS2 18XMFL BLK (SUTURE)
SUT ETHILON 5-0 FS-2 18 BLK (SUTURE) ×3 IMPLANT
SUTURE EHLN 3-0 FS-10 30 BLK (SUTURE) IMPLANT
SUTURE ETHLN 4-0 FS2 18XMF BLK (SUTURE) IMPLANT
SYR 3ML 18GX1 1/2 (SYRINGE) ×3 IMPLANT
SYR 3ML LL SCALE MARK (SYRINGE) ×3 IMPLANT

## 2017-11-06 NOTE — Op Note (Signed)
11/06/2017  12:30 PM  PATIENT:  Jesus Blevins    PRE-OPERATIVE DIAGNOSIS:  TRIGGER FINGER, LEFT INDEX FINGER  POST-OPERATIVE DIAGNOSIS:  Same  PROCEDURE:  OPEN RELEASE OF LEFT INDEX TRIGGER FINGER  SURGEON:  Thornton Park, MD  ANESTHESIA:   General  PREOPERATIVE INDICATIONS:  [name] is a  71 y.o. male with a diagnosis of TRIGGER FINGER OF LEFT INDEX FINGER  who failed conservative treatment including corticosteroid injections and elected for surgical management.    I discussed the risks and benefits of surgery. The risks include but are not limited to infection, bleeding requiring blood transfusion, nerve or blood vessel injury, joint stiffness or loss of motion, persistent pain, weakness or instability, malunion, nonunion and hardware failure and the need for further surgery. Medical risks include but are not limited to DVT and pulmonary embolism, myocardial infarction, stroke, pneumonia, respiratory failure and death. Patient understood these risks and wished to proceed.   OPERATIVE FINDINGS: thickened A1 pulley.  OPERATIVE PROCEDURE: Patient was met in the preoperative area. The left index finger was signed according the hospital's protocol.  History and physical was updated. Patient brought to the operating room was placed supine on the operative table. He underwent general anesthesia with an LMA. He was prepped and draped in a sterile fashion.  A timeout was performed to verify the patient's name, date of birth, medical record number, correct site of surgery and confirm the patient received antibiotics. Once all in attendance were in agreement, the case began.  Patient had his left upper extremity exsanguinated with an Esmarch. Tourniquet was inflated on his left upper arm to 250 mmHg. It was inflated for 22 minutes.  An oblique incision was made in one of the palmar creases just over the A1 pulley. Careful dissection was performed down to the A1 pulley which was found to be  significantly thickened. Care was taken to avoid injury to the digital nerves and arteries.  A hemostat was placed under the A1 pulley but above the flexor tendonsto protect them from injury. A small Beaver blade was then used to release the A1 pulley with a longitudinal incision in line with the tendons. Once the A1 pulley was released, it was carefully examined to make sure a complete release was achieved. The flexor tendons moved freely with passive flexion and extension of the index finger.  The wound was copiously irrigated. The incision was closed with 5-0 nylon suture in an interrupted fashion. A dry sterile dressing was applied to the left hand. The tourniquet was let down at 22 minutes. The patient's hand was well perfused. He wasextubated and brought to the PACU in stable condition. I spoke with the patient's family postoperatively to let them know the surgery had been performed without complication and the patient was stable in the recovery room. I was scrubbed and present for the entire case, and all sharp and instrument counts were correct at the conclusion of the case.

## 2017-11-06 NOTE — Anesthesia Postprocedure Evaluation (Signed)
Anesthesia Post Note  Patient: Jesus Blevins  Procedure(s) Performed: RELEASE TRIGGER FINGER (Left Index Finger)  Patient location during evaluation: PACU Anesthesia Type: General Level of consciousness: awake and alert Pain management: pain level controlled Vital Signs Assessment: post-procedure vital signs reviewed and stable Respiratory status: spontaneous breathing and respiratory function stable Cardiovascular status: stable Anesthetic complications: no     Last Vitals:  Vitals:   11/06/17 1226 11/06/17 1236  BP: (!) 158/77 132/74  Pulse: 62 (!) 57  Resp: 12 (!) 8  Temp:    SpO2: 100% 98%    Last Pain:  Vitals:   11/06/17 1221  TempSrc:   PainSc: Asleep                 Juris Gosnell K

## 2017-11-06 NOTE — H&P (Signed)
The patient has been re-examined, and the chart reviewed, and there have been no interval changes to the documented history and physical.    The risks, benefits, and alternatives have been discussed at length, and the patient is willing to proceed.   

## 2017-11-06 NOTE — Anesthesia Procedure Notes (Signed)
Procedure Name: LMA Insertion Performed by: Lance Muss, CRNA Pre-anesthesia Checklist: Patient identified, Patient being monitored, Timeout performed, Emergency Drugs available and Suction available Patient Re-evaluated:Patient Re-evaluated prior to induction Oxygen Delivery Method: Circle system utilized Preoxygenation: Pre-oxygenation with 100% oxygen Induction Type: IV induction LMA: LMA inserted LMA Size: 4.0 Tube type: Oral Number of attempts: 1 Placement Confirmation: positive ETCO2 and breath sounds checked- equal and bilateral Tube secured with: Tape Dental Injury: Teeth and Oropharynx as per pre-operative assessment

## 2017-11-06 NOTE — Anesthesia Preprocedure Evaluation (Signed)
Anesthesia Evaluation  Patient identified by MRN, date of birth, ID band Patient awake    Reviewed: Allergy & Precautions, NPO status , Patient's Chart, lab work & pertinent test results  History of Anesthesia Complications Negative for: history of anesthetic complications  Airway Mallampati: III       Dental   Pulmonary neg sleep apnea, neg COPD,           Cardiovascular hypertension, Pt. on medications + CAD  (-) Past MI and (-) CHF (-) dysrhythmias (-) Valvular Problems/Murmurs     Neuro/Psych neg Seizures Anxiety Depression    GI/Hepatic Neg liver ROS, GERD  Medicated and Controlled,  Endo/Other  neg diabetes  Renal/GU negative Renal ROS     Musculoskeletal   Abdominal   Peds  Hematology  (+) anemia ,   Anesthesia Other Findings   Reproductive/Obstetrics                             Anesthesia Physical Anesthesia Plan  ASA: III  Anesthesia Plan: General   Post-op Pain Management:    Induction: Intravenous  PONV Risk Score and Plan: 2 and Dexamethasone and Ondansetron  Airway Management Planned: LMA  Additional Equipment:   Intra-op Plan:   Post-operative Plan:   Informed Consent: I have reviewed the patients History and Physical, chart, labs and discussed the procedure including the risks, benefits and alternatives for the proposed anesthesia with the patient or authorized representative who has indicated his/her understanding and acceptance.     Plan Discussed with:   Anesthesia Plan Comments:         Anesthesia Quick Evaluation

## 2017-11-06 NOTE — Transfer of Care (Signed)
Immediate Anesthesia Transfer of Care Note  Patient: Jesus Blevins  Procedure(s) Performed: RELEASE TRIGGER FINGER (Left Index Finger)  Patient Location: PACU  Anesthesia Type:General  Level of Consciousness: awake and responds to stimulation  Airway & Oxygen Therapy: Patient Spontanous Breathing and Patient connected to face mask oxygen  Post-op Assessment: Report given to RN and Post -op Vital signs reviewed and stable  Post vital signs: Reviewed and stable  Last Vitals:  Vitals:   11/06/17 1025 11/06/17 1226  BP: 131/68 (!) 158/77  Pulse: (!) 50 62  Resp:  12  Temp: (!) 36.4 C   SpO2:  100%    Last Pain:  Vitals:   11/06/17 1025  TempSrc: Oral  PainSc: 3          Complications: No apparent anesthesia complications

## 2017-11-06 NOTE — Anesthesia Post-op Follow-up Note (Signed)
Anesthesia QCDR form completed.        

## 2017-11-06 NOTE — Discharge Instructions (Signed)

## 2017-11-07 ENCOUNTER — Encounter: Payer: Self-pay | Admitting: Orthopedic Surgery

## 2018-07-06 ENCOUNTER — Other Ambulatory Visit: Payer: Self-pay | Admitting: Physician Assistant

## 2018-07-06 DIAGNOSIS — M5441 Lumbago with sciatica, right side: Secondary | ICD-10-CM

## 2018-07-06 DIAGNOSIS — M5442 Lumbago with sciatica, left side: Principal | ICD-10-CM

## 2018-07-30 ENCOUNTER — Ambulatory Visit
Admission: RE | Admit: 2018-07-30 | Discharge: 2018-07-30 | Disposition: A | Discharge status: Home / Self Care | Payer: Medicare Other | Source: Ambulatory Visit | Attending: Physician Assistant | Admitting: Physician Assistant

## 2018-07-30 DIAGNOSIS — M5442 Lumbago with sciatica, left side: Secondary | ICD-10-CM | POA: Insufficient documentation

## 2018-07-30 DIAGNOSIS — M5126 Other intervertebral disc displacement, lumbar region: Secondary | ICD-10-CM | POA: Diagnosis not present

## 2018-07-30 DIAGNOSIS — M48061 Spinal stenosis, lumbar region without neurogenic claudication: Secondary | ICD-10-CM | POA: Diagnosis not present

## 2018-07-30 DIAGNOSIS — M5441 Lumbago with sciatica, right side: Secondary | ICD-10-CM | POA: Insufficient documentation

## 2018-08-05 ENCOUNTER — Ambulatory Visit: Payer: Medicare Other | Admitting: Anesthesiology

## 2018-08-05 ENCOUNTER — Ambulatory Visit
Admission: RE | Admit: 2018-08-05 | Discharge: 2018-08-05 | Disposition: A | Discharge status: Home / Self Care | Payer: Medicare Other | Source: Ambulatory Visit | Attending: Unknown Physician Specialty | Admitting: Unknown Physician Specialty

## 2018-08-05 ENCOUNTER — Encounter
Admission: RE | Disposition: A | Discharge status: Home / Self Care | Payer: Self-pay | Source: Ambulatory Visit | Attending: Unknown Physician Specialty

## 2018-08-05 ENCOUNTER — Other Ambulatory Visit: Payer: Self-pay

## 2018-08-05 DIAGNOSIS — Z09 Encounter for follow-up examination after completed treatment for conditions other than malignant neoplasm: Secondary | ICD-10-CM | POA: Insufficient documentation

## 2018-08-05 DIAGNOSIS — Z7982 Long term (current) use of aspirin: Secondary | ICD-10-CM | POA: Insufficient documentation

## 2018-08-05 DIAGNOSIS — K219 Gastro-esophageal reflux disease without esophagitis: Secondary | ICD-10-CM | POA: Diagnosis not present

## 2018-08-05 DIAGNOSIS — F419 Anxiety disorder, unspecified: Secondary | ICD-10-CM | POA: Diagnosis not present

## 2018-08-05 DIAGNOSIS — I10 Essential (primary) hypertension: Secondary | ICD-10-CM | POA: Diagnosis not present

## 2018-08-05 DIAGNOSIS — D123 Benign neoplasm of transverse colon: Secondary | ICD-10-CM | POA: Insufficient documentation

## 2018-08-05 DIAGNOSIS — I2510 Atherosclerotic heart disease of native coronary artery without angina pectoris: Secondary | ICD-10-CM | POA: Insufficient documentation

## 2018-08-05 DIAGNOSIS — Z7951 Long term (current) use of inhaled steroids: Secondary | ICD-10-CM | POA: Diagnosis not present

## 2018-08-05 DIAGNOSIS — Z96652 Presence of left artificial knee joint: Secondary | ICD-10-CM | POA: Insufficient documentation

## 2018-08-05 DIAGNOSIS — Z86010 Personal history of colonic polyps: Secondary | ICD-10-CM | POA: Diagnosis not present

## 2018-08-05 DIAGNOSIS — Z79899 Other long term (current) drug therapy: Secondary | ICD-10-CM | POA: Diagnosis not present

## 2018-08-05 DIAGNOSIS — F329 Major depressive disorder, single episode, unspecified: Secondary | ICD-10-CM | POA: Diagnosis not present

## 2018-08-05 DIAGNOSIS — K5730 Diverticulosis of large intestine without perforation or abscess without bleeding: Secondary | ICD-10-CM | POA: Insufficient documentation

## 2018-08-05 HISTORY — PX: COLONOSCOPY WITH PROPOFOL: SHX5780

## 2018-08-05 SURGERY — COLONOSCOPY WITH PROPOFOL
Anesthesia: General

## 2018-08-05 MED ORDER — SODIUM CHLORIDE 0.9 % IV SOLN: INTRAVENOUS | Status: DC

## 2018-08-05 MED ORDER — LIDOCAINE HCL (PF) 2 % IJ SOLN
INTRAMUSCULAR | Status: AC
  Filled 2018-08-05: qty 10

## 2018-08-05 MED ORDER — PROPOFOL 500 MG/50ML IV EMUL
INTRAVENOUS | Status: DC | PRN
  Administered 2018-08-05: 150 ug/kg/min via INTRAVENOUS

## 2018-08-05 MED ORDER — EPHEDRINE SULFATE 50 MG/ML IJ SOLN
INTRAMUSCULAR | Status: AC
  Filled 2018-08-05: qty 1

## 2018-08-05 MED ORDER — SODIUM CHLORIDE 0.9 % IV SOLN
INTRAVENOUS | Status: DC
  Administered 2018-08-05: 08:00:00 via INTRAVENOUS

## 2018-08-05 MED ORDER — EPHEDRINE SULFATE 50 MG/ML IJ SOLN
INTRAMUSCULAR | Status: DC | PRN
  Administered 2018-08-05 (×3): 5 mg via INTRAVENOUS

## 2018-08-05 MED ORDER — PROPOFOL 10 MG/ML IV BOLUS
INTRAVENOUS | Status: DC | PRN
  Administered 2018-08-05 (×2): 50 mg via INTRAVENOUS
  Administered 2018-08-05 (×2): 20 mg via INTRAVENOUS
  Administered 2018-08-05: 10 mg via INTRAVENOUS

## 2018-08-05 MED ORDER — LIDOCAINE HCL (CARDIAC) PF 100 MG/5ML IV SOSY
PREFILLED_SYRINGE | INTRAVENOUS | Status: DC | PRN
  Administered 2018-08-05: 30 mg via INTRAVENOUS

## 2018-08-05 MED ORDER — PIPERACILLIN-TAZOBACTAM 3.375 G IVPB
INTRAVENOUS | Status: AC
  Administered 2018-08-05: 3.375 g via INTRAVENOUS
  Filled 2018-08-05: qty 50

## 2018-08-05 MED ORDER — PIPERACILLIN-TAZOBACTAM 3.375 G IVPB 30 MIN
3.3750 g | Freq: Once | INTRAVENOUS | Status: AC
  Administered 2018-08-05: 3.375 g via INTRAVENOUS
  Filled 2018-08-05: qty 50

## 2018-08-05 NOTE — Op Note (Signed)
[facility] Gastroenterology Patient Name: Jesus Blevins Procedure Date: 08/05/2018 8:11 AM MRN: [id number] Account #: [id number] Date of Birth: 1946-07-05 Admit Type: Outpatient Age: 72 Room: [facility] ENDO ROOM 3 Gender: Male Note Status: Finalized Procedure:            Colonoscopy Indications:          High risk colon cancer surveillance: Personal history                        of colonic polyps Providers:            Manya Silvas, MD Referring MD:         Ramonita Lab, MD (Referring MD) Medicines:            Propofol per Anesthesia Complications:        No immediate complications. Procedure:            Pre-Anesthesia Assessment:                       - After reviewing the risks and benefits, the patient                        was deemed in satisfactory condition to undergo the                        procedure.                       After obtaining informed consent, the colonoscope was                        passed under direct vision. Throughout the procedure,                        the patient's blood pressure, pulse, and oxygen                        saturations were monitored continuously. The was                        introduced through the anus and advanced to the the                        cecum, identified by appendiceal orifice and ileocecal                        valve. The entire colon was examined. Findings:      A diminutive polyp was found in the transverse colon. The polyp was       sessile. The polyp was removed with a jumbo cold forceps. Resection and       retrieval were complete. To prevent bleeding after the polypectomy, one       hemostatic clip was successfully placed. There was no bleeding at the       end of the procedure.      Multiple small-mouthed diverticula were found in the sigmoid colon.      The exam was otherwise without abnormality. Impression:           - One diminutive polyp in the transverse colon, removed      with a jumbo cold forceps. Resected and retrieved. Clip  was placed.                       - Diverticulosis in the sigmoid colon.                       - The examination was otherwise normal. Recommendation:       - Await pathology results. Manya Silvas, MD 08/05/2018 8:57:50 AM This report has been signed electronically. Number of Addenda: 0 Note Initiated On: 08/05/2018 8:11 AM Scope Withdrawal Time: 0 hours 16 minutes 36 seconds  Total Procedure Duration: 0 hours 30 minutes 42 seconds       [facility]

## 2018-08-05 NOTE — Anesthesia Preprocedure Evaluation (Addendum)
Anesthesia Evaluation  Patient identified by MRN, date of birth, ID band Patient awake    Reviewed: Allergy & Precautions, H&P , NPO status , Patient's Chart, lab work & pertinent test results  Airway Mallampati: III       Dental   Pulmonary neg pulmonary ROS,           Cardiovascular hypertension, + CAD       Neuro/Psych PSYCHIATRIC DISORDERS Anxiety Depression negative neurological ROS  negative psych ROS   GI/Hepatic Neg liver ROS, GERD  ,  Endo/Other  negative endocrine ROS  Renal/GU negative Renal ROS  negative genitourinary   Musculoskeletal  (+) Arthritis ,   Abdominal   Peds  Hematology negative hematology ROS (+) anemia ,   Anesthesia Other Findings Past Medical History: No date: Allergic rhinitis No date: Anemia No date: Anxiety No date: Coronary artery disease No date: Depression No date: GERD (gastroesophageal reflux disease) No date: Hypercalcemia No date: Hypertension No date: Osteoarthritis No date: Restless leg No date: Sinusitis  Past Surgical History: No date: BACK SURGERY No date: COLONOSCOPY 04/21/2015: COLONOSCOPY; N/A     Comment:  Procedure: COLONOSCOPY;  Surgeon: Manya Silvas, MD;               Location: [place];  Service: Endoscopy;                Laterality: N/A; 2017: JOINT REPLACEMENT; Left No date: knee arthoscopy No date: Lumb disectomy cervicle/lumbar multiple levels No date: sinus septum and polyp surgery No date: spine surgery L3-L5 No date: Thumb trigger release; Left No date: tongue polyp excision 04/16/2016: TOTAL KNEE ARTHROPLASTY; Left     Comment:  Procedure: TOTAL KNEE ARTHROPLASTY;  Surgeon: Thornton Park, MD;  Location: [facility];  Service:               Orthopedics;  Laterality: Left; 11/06/2017: TRIGGER FINGER RELEASE; Left     Comment:  Procedure: RELEASE TRIGGER FINGER;  Surgeon: Thornton Park, MD;  Location:  [facility];  Service: Orthopedics;                Laterality: Left;     Reproductive/Obstetrics negative OB ROS                            Anesthesia Physical Anesthesia Plan  ASA: III  Anesthesia Plan: General   Post-op Pain Management:    Induction:   PONV Risk Score and Plan: Propofol infusion  Airway Management Planned: Natural Airway  Additional Equipment:   Intra-op Plan:   Post-operative Plan:   Informed Consent: I have reviewed the patients History and Physical, chart, labs and discussed the procedure including the risks, benefits and alternatives for the proposed anesthesia with the patient or authorized representative who has indicated his/her understanding and acceptance.   Dental Advisory Given  Plan Discussed with: Anesthesiologist, CRNA and Surgeon  Anesthesia Plan Comments:         Anesthesia Quick Evaluation

## 2018-08-05 NOTE — Anesthesia Postprocedure Evaluation (Signed)
Anesthesia Post Note  Patient: Jesus Blevins  Procedure(s) Performed: COLONOSCOPY WITH PROPOFOL (N/A )  Patient location during evaluation: PACU Anesthesia Type: General Level of consciousness: awake and alert Pain management: pain level controlled Vital Signs Assessment: post-procedure vital signs reviewed and stable Respiratory status: spontaneous breathing, nonlabored ventilation and respiratory function stable Cardiovascular status: blood pressure returned to baseline and stable Postop Assessment: no apparent nausea or vomiting Anesthetic complications: no     Last Vitals:  Vitals:   08/05/18 0856 08/05/18 0906  BP:  117/71  Pulse: (!) 58   Resp: 13   Temp: (!) 36.3 C   SpO2:      Last Pain:  Vitals:   08/05/18 0926  PainSc: 0-No pain                 Durenda Hurt

## 2018-08-05 NOTE — Transfer of Care (Signed)
Immediate Anesthesia Transfer of Care Note  Patient: Jesus Blevins  Procedure(s) Performed: COLONOSCOPY WITH PROPOFOL (N/A )  Patient Location: PACU and Endoscopy Unit  Anesthesia Type:General  Level of Consciousness: awake  Airway & Oxygen Therapy: Patient connected to nasal cannula oxygen  Post-op Assessment: Report given to RN  Post vital signs: stable  Last Vitals:  Vitals Value Taken Time  BP 91/48 08/05/2018  8:57 AM  Temp 36.3 C 08/05/2018  8:56 AM  Pulse 58 08/05/2018  8:57 AM  Resp 15 08/05/2018  8:57 AM  SpO2 100 % 08/05/2018  8:57 AM  Vitals shown include unvalidated device data.  Last Pain:  Vitals:   08/05/18 0856  PainSc: Asleep         Complications: No apparent anesthesia complications

## 2018-08-05 NOTE — Anesthesia Post-op Follow-up Note (Signed)
Anesthesia QCDR form completed.        

## 2018-08-05 NOTE — H&P (Signed)
Primary Care Physician:  Adin Hector, MD Primary Gastroenterologist:  Dr. Vira Agar  Pre-Procedure History & Physical: HPI:  Jesus Blevins is a 72 y.o. male is here for an colonoscopy.  Done for personal history of colon polyps.   Past Medical History:  Diagnosis Date  . Allergic rhinitis   . Anemia   . Anxiety   . Coronary artery disease   . Depression   . GERD (gastroesophageal reflux disease)   . Hypercalcemia   . Hypertension   . Osteoarthritis   . Restless leg   . Sinusitis     Past Surgical History:  Procedure Laterality Date  . BACK SURGERY    . COLONOSCOPY    . COLONOSCOPY N/A 04/21/2015   Procedure: COLONOSCOPY;  Surgeon: Manya Silvas, MD;  Location: [facility];  Service: Endoscopy;  Laterality: N/A;  . JOINT REPLACEMENT Left 2017  . knee arthoscopy    . Lumb disectomy cervicle/lumbar multiple levels    . sinus septum and polyp surgery    . spine surgery L3-L5    . Thumb trigger release Left   . tongue polyp excision    . TOTAL KNEE ARTHROPLASTY Left 04/16/2016   Procedure: TOTAL KNEE ARTHROPLASTY;  Surgeon: Thornton Park, MD;  Location: [facility];  Service: Orthopedics;  Laterality: Left;  . TRIGGER FINGER RELEASE Left 11/06/2017   Procedure: RELEASE TRIGGER FINGER;  Surgeon: Thornton Park, MD;  Location: [facility];  Service: Orthopedics;  Laterality: Left;    Prior to Admission medications   Medication Sig Start Date End Date Taking? Authorizing Provider  ALPRAZolam Duanne Moron) 0.5 MG tablet Take 0.5 mg 2 (two) times daily as needed by mouth for anxiety (takes 1 dose every morning and another dose only if needed).    Yes [provider]  AMOXICILLIN PO Take 125 mg See admin instructions by mouth. Takes 1 capsule 125 mg 1 hour prior to denal procedure   Yes [provider]  aspirin 81 MG tablet Take 81 mg every other day by mouth.    Yes [provider]  atorvastatin (LIPITOR) 40 MG tablet Take 40 mg by mouth at bedtime.     Yes [provider]  azelastine (ASTELIN) 0.1 % nasal spray Place 1 spray at bedtime into both nostrils. Use in each nostril as directed    Yes [provider]  fexofenadine (ALLEGRA) 180 MG tablet Take 180 mg by mouth daily.   Yes [provider]  Flax Oil-Fish Oil-Borage Oil (FISH OIL-FLAX OIL-BORAGE OIL PO) Take 1 capsule 2 (two) times daily by mouth. 800 mg fish oil  800 mg flaxseed oil 400 mg Borage oil   Yes [provider]  fluticasone (FLONASE) 50 MCG/ACT nasal spray Place 2 sprays daily into both nostrils.    Yes [provider]  Glucosamine-Chondroit-Vit C-Mn (GLUCOSAMINE CHONDR 1500 COMPLX) CAPS Take 1 capsule by mouth 2 (two) times daily.   Yes [provider]  IRON PO Take 65 mg by mouth every other day.    Yes [provider]  losartan (COZAAR) 50 MG tablet Take 50 mg by mouth daily.   Yes [provider]  montelukast (SINGULAIR) 10 MG tablet Take 10 mg by mouth at bedtime.   Yes [provider]  Multiple Minerals-Vitamins (CALCIUM-MAGNESIUM-ZINC-D3) TABS Take 1 tablet every evening by mouth.   Yes [provider]  Multiple Vitamin (MULTIVITAMIN) capsule Take 0.5 capsules 2 (two) times daily by mouth.    Yes [provider]  omeprazole (PRILOSEC) 40 MG capsule Take 40 mg daily as needed by mouth (when traveling).    Yes [provider]  ondansetron (ZOFRAN) 4 MG tablet Take 1 tablet (4 mg total) by mouth every 8 (eight) hours as needed for nausea or vomiting. 11/06/17  Yes Thornton Park, MD  ranitidine (ZANTAC) 150 MG tablet Take 150 mg 3 (three) times daily as needed by mouth for heartburn (takes 2 times daily and a 3rd dose if needed).   Yes [provider]  sertraline (ZOLOFT) 50 MG tablet Take 50 mg by mouth at bedtime.    Yes [provider]  B Complex-C (B-COMPLEX WITH VITAMIN C) tablet Take 1 tablet by mouth daily.    [provider]  Biotin  10000 MCG TABS Take 10,000 mcg daily by mouth.    [provider]  Cholecalciferol 2000 units CAPS Take 2,000 Units daily by mouth.    [provider]  Coenzyme Q10 (CO Q 10 PO) Take 200 mg daily by mouth.    [provider]  Misc Natural Products (LUTEIN 20) CAPS Take 20 mg daily by mouth.    [provider]  oxyCODONE (OXY IR/ROXICODONE) 5 MG immediate release tablet Take 1 tablet (5 mg total) by mouth every 4 (four) hours as needed for severe pain. Patient not taking: Reported on 08/05/2018 11/06/17   Thornton Park, MD    Allergies as of 05/14/2018 - Review Complete 11/06/2017  Allergen Reaction Noted  . Elavil [amitriptyline] Anxiety 04/21/2015  . Sulfa antibiotics Rash 04/13/2015    History reviewed. No pertinent family history.  Social History   Socioeconomic History  . Marital status: Married    Spouse name: Not on file  . Number of children: Not on file  . Years of education: Not on file  . Highest education level: Not on file  Occupational History  . Not on file  Social Needs  . Financial resource strain: Not on file  . Food insecurity:    Worry: Not on file    Inability: Not on file  . Transportation needs:    Medical: Not on file    Non-medical: Not on file  Tobacco Use  . Smoking status: Never Smoker  . Smokeless tobacco: Former Systems developer    Types: Chew  Substance and Sexual Activity  . Alcohol use: Yes    Comment: occasionally  . Drug use: No    Comment: CBD oil daily  . Sexual activity: Not on file  Lifestyle  . Physical activity:    Days per week: Not on file    Minutes per session: Not on file  . Stress: Not on file  Relationships  . Social connections:    Talks on phone: Not on file    Gets together: Not on file    Attends religious service: Not on file    Active member of club or organization: Not on file    Attends meetings of clubs or organizations: Not on file    Relationship status: Not on file  . Intimate  partner violence:    Fear of current or ex partner: Not on file    Emotionally abused: Not on file    Physically abused: Not on file    Forced sexual activity: Not on file  Other Topics Concern  . Not on file  Social History Narrative  . Not on file    Review of Systems: See HPI, otherwise negative ROS  Physical Exam: There were  no vitals taken for this visit. General:   Alert,  pleasant and cooperative in NAD Head:  Normocephalic and atraumatic. Neck:  Supple; no masses or thyromegaly. Lungs:  Clear throughout to auscultation.    Heart:  Regular rate and rhythm. Abdomen:  Soft, nontender and nondistended. Normal bowel sounds, without guarding, and without rebound.   Neurologic:  Alert and  oriented x4;  grossly normal neurologically.  Impression/Plan: Jesus Blevins is here for an colonoscopy to be performed for [facility] colon polyps.  Risks, benefits, limitations, and alternatives regarding  colonoscopy have been reviewed with the patient.  Questions have been answered.  All parties agreeable.   Gaylyn Cheers, MD  08/05/2018, 8:07 AM

## 2018-08-06 ENCOUNTER — Encounter: Payer: Self-pay | Admitting: Unknown Physician Specialty

## 2018-08-07 LAB — SURGICAL PATHOLOGY

## 2018-08-19 ENCOUNTER — Other Ambulatory Visit: Payer: Self-pay | Admitting: Neurological Surgery

## 2018-08-24 ENCOUNTER — Encounter (HOSPITAL_COMMUNITY): Payer: Self-pay | Admitting: *Deleted

## 2018-08-24 ENCOUNTER — Other Ambulatory Visit: Payer: Self-pay

## 2018-08-24 NOTE — Progress Notes (Signed)
Pt states he has CAD hx, no stents, no bypass. States he sees Dr. Ubaldo Glassing at [facility] and has had 2 or 3 stress tests since 2010 and has approx 40% blockage in one artery. Denies any recent chest pain or sob. Pt is on Aspirin, but has not taken it for about 2 weeks. Pt denies being diabetic.

## 2018-08-24 NOTE — Anesthesia Preprocedure Evaluation (Addendum)
Anesthesia Evaluation  Patient identified by MRN, date of birth, ID band Patient awake    Reviewed: Allergy & Precautions, H&P , NPO status , Patient's Chart, lab work & pertinent test results  Airway Mallampati: IV   Neck ROM: full  Mouth opening: Limited Mouth Opening  Dental  (+) Dental Advisory Given, Teeth Intact   Pulmonary neg pulmonary ROS,    breath sounds clear to auscultation       Cardiovascular hypertension, Pt. on medications + CAD   Rhythm:regular Rate:Normal     Neuro/Psych PSYCHIATRIC DISORDERS Anxiety Depression negative neurological ROS  negative psych ROS   GI/Hepatic Neg liver ROS, GERD  ,  Endo/Other  negative endocrine ROS  Renal/GU negative Renal ROS     Musculoskeletal  (+) Arthritis ,   Abdominal   Peds  Hematology negative hematology ROS (+) anemia ,   Anesthesia Other Findings Past Medical History: No date: Allergic rhinitis No date: Anemia No date: Anxiety No date: Coronary artery disease No date: Depression No date: GERD (gastroesophageal reflux disease) No date: Hypercalcemia No date: Hypertension No date: Osteoarthritis No date: Restless leg No date: Sinusitis  Past Surgical History: No date: BACK SURGERY No date: COLONOSCOPY 04/21/2015: COLONOSCOPY; N/A     Comment:  Procedure: COLONOSCOPY;  Surgeon: Manya Silvas, MD;               Location: [place];  Service: Endoscopy;                Laterality: N/A; 2017: JOINT REPLACEMENT; Left No date: knee arthoscopy No date: Lumb disectomy cervicle/lumbar multiple levels No date: sinus septum and polyp surgery No date: spine surgery L3-L5 No date: Thumb trigger release; Left No date: tongue polyp excision 04/16/2016: TOTAL KNEE ARTHROPLASTY; Left     Comment:  Procedure: TOTAL KNEE ARTHROPLASTY;  Surgeon: Thornton Park, MD;  Location: [facility];  Service:               Orthopedics;  Laterality:  Left; 11/06/2017: TRIGGER FINGER RELEASE; Left     Comment:  Procedure: RELEASE TRIGGER FINGER;  Surgeon: Thornton Park, MD;  Location: [facility];  Service: Orthopedics;                Laterality: Left;     Reproductive/Obstetrics negative OB ROS                           Anesthesia Physical  Anesthesia Plan  ASA: III  Anesthesia Plan: General   Post-op Pain Management:    Induction: Intravenous  PONV Risk Score and Plan: Ondansetron, Dexamethasone and Treatment may vary due to age or medical condition  Airway Management Planned: Oral ETT  Additional Equipment:   Intra-op Plan:   Post-operative Plan: Extubation in OR  Informed Consent: I have reviewed the patients History and Physical, chart, labs and discussed the procedure including the risks, benefits and alternatives for the proposed anesthesia with the patient or authorized representative who has indicated his/her understanding and acceptance.   Dental advisory given and Dental Advisory Given  Plan Discussed with: CRNA  Anesthesia Plan Comments:        Anesthesia Quick Evaluation

## 2018-08-25 ENCOUNTER — Encounter (HOSPITAL_COMMUNITY): Payer: Self-pay

## 2018-08-25 ENCOUNTER — Inpatient Hospital Stay (HOSPITAL_COMMUNITY): Payer: Medicare Other | Admitting: Anesthesiology

## 2018-08-25 ENCOUNTER — Inpatient Hospital Stay (HOSPITAL_COMMUNITY): Payer: Medicare Other

## 2018-08-25 ENCOUNTER — Inpatient Hospital Stay (HOSPITAL_COMMUNITY)
Admission: RE | Admit: 2018-08-25 | Discharge: 2018-08-28 | DRG: 460 | Disposition: A | Discharge status: Home / Self Care | Payer: Medicare Other | Attending: Neurological Surgery | Admitting: Neurological Surgery

## 2018-08-25 ENCOUNTER — Encounter (HOSPITAL_COMMUNITY)
Admission: RE | Disposition: A | Discharge status: Home / Self Care | Payer: Self-pay | Source: Home / Self Care | Attending: Neurological Surgery

## 2018-08-25 DIAGNOSIS — M2578 Osteophyte, vertebrae: Secondary | ICD-10-CM | POA: Diagnosis present

## 2018-08-25 DIAGNOSIS — M5116 Intervertebral disc disorders with radiculopathy, lumbar region: Principal | ICD-10-CM | POA: Diagnosis present

## 2018-08-25 DIAGNOSIS — G2581 Restless legs syndrome: Secondary | ICD-10-CM | POA: Diagnosis present

## 2018-08-25 DIAGNOSIS — I10 Essential (primary) hypertension: Secondary | ICD-10-CM | POA: Diagnosis present

## 2018-08-25 DIAGNOSIS — M1990 Unspecified osteoarthritis, unspecified site: Secondary | ICD-10-CM | POA: Diagnosis present

## 2018-08-25 DIAGNOSIS — Z87891 Personal history of nicotine dependence: Secondary | ICD-10-CM

## 2018-08-25 DIAGNOSIS — M5416 Radiculopathy, lumbar region: Secondary | ICD-10-CM | POA: Diagnosis present

## 2018-08-25 DIAGNOSIS — I2510 Atherosclerotic heart disease of native coronary artery without angina pectoris: Secondary | ICD-10-CM | POA: Diagnosis present

## 2018-08-25 DIAGNOSIS — M79604 Pain in right leg: Secondary | ICD-10-CM | POA: Diagnosis present

## 2018-08-25 DIAGNOSIS — K589 Irritable bowel syndrome without diarrhea: Secondary | ICD-10-CM | POA: Diagnosis present

## 2018-08-25 DIAGNOSIS — Z87442 Personal history of urinary calculi: Secondary | ICD-10-CM

## 2018-08-25 DIAGNOSIS — K219 Gastro-esophageal reflux disease without esophagitis: Secondary | ICD-10-CM | POA: Diagnosis present

## 2018-08-25 HISTORY — DX: Irritable bowel syndrome, unspecified: K58.9

## 2018-08-25 HISTORY — DX: Personal history of urinary calculi: Z87.442

## 2018-08-25 HISTORY — DX: Diverticulosis of intestine, part unspecified, without perforation or abscess without bleeding: K57.90

## 2018-08-25 HISTORY — PX: TRANSFORAMINAL LUMBAR INTERBODY FUSION (TLIF) WITH PEDICLE SCREW FIXATION 1 LEVEL: SHX6141

## 2018-08-25 LAB — BASIC METABOLIC PANEL
Anion gap: 9 (ref 5–15)
BUN: 15 mg/dL (ref 8–23)
CHLORIDE: 108 mmol/L (ref 98–111)
CO2: 23 mmol/L (ref 22–32)
CREATININE: 0.77 mg/dL (ref 0.61–1.24)
Calcium: 9.4 mg/dL (ref 8.9–10.3)
GFR calc Af Amer: >60 mL/min (ref >60)
GFR calc non Af Amer: >60 mL/min (ref >60)
Glucose, Bld: 101 mg/dL — ABNORMAL HIGH (ref 70–99)
Potassium: 3.8 mmol/L (ref 3.5–5.1)
SODIUM: 140 mmol/L (ref 135–145)

## 2018-08-25 LAB — CBC
HCT: 40.6 % (ref 39.0–52.0)
Hemoglobin: 13.6 g/dL (ref 13.0–17.0)
MCH: 30.3 pg (ref 26.0–34.0)
MCHC: 33.5 g/dL (ref 30.0–36.0)
MCV: 90.4 fL (ref 78.0–100.0)
Platelets: 173 10*3/uL (ref 150–400)
RBC: 4.49 MIL/uL (ref 4.22–5.81)
RDW: 12.4 % (ref 11.5–15.5)
WBC: 7.1 10*3/uL (ref 4.0–10.5)

## 2018-08-25 LAB — ABO/RH: ABO/RH(D): O POS

## 2018-08-25 LAB — TYPE AND SCREEN
ABO/RH(D): O POS
ANTIBODY SCREEN: NEGATIVE

## 2018-08-25 SURGERY — TRANSFORAMINAL LUMBAR INTERBODY FUSION (TLIF) WITH PEDICLE SCREW FIXATION 1 LEVEL
Anesthesia: General | Laterality: Right

## 2018-08-25 MED ORDER — ATORVASTATIN CALCIUM 40 MG PO TABS
40.0000 mg | ORAL_TABLET | Freq: Every day | ORAL | Status: DC
  Administered 2018-08-25 – 2018-08-27 (×3): 40 mg via ORAL
  Filled 2018-08-25 (×3): qty 1

## 2018-08-25 MED ORDER — ROCURONIUM BROMIDE 50 MG/5ML IV SOSY
PREFILLED_SYRINGE | INTRAVENOUS | Status: AC
  Filled 2018-08-25: qty 10

## 2018-08-25 MED ORDER — ROCURONIUM BROMIDE 50 MG/5ML IV SOSY
PREFILLED_SYRINGE | INTRAVENOUS | Status: AC
  Filled 2018-08-25: qty 5

## 2018-08-25 MED ORDER — SODIUM CHLORIDE 0.9 % IV SOLN
INTRAVENOUS | Status: DC | PRN
  Administered 2018-08-25: 50 ug/min via INTRAVENOUS

## 2018-08-25 MED ORDER — FENTANYL CITRATE (PF) 250 MCG/5ML IJ SOLN
INTRAMUSCULAR | Status: AC
  Filled 2018-08-25: qty 5

## 2018-08-25 MED ORDER — THROMBIN 5000 UNITS EX SOLR
CUTANEOUS | Status: AC
  Filled 2018-08-25: qty 5000

## 2018-08-25 MED ORDER — PROPOFOL 10 MG/ML IV BOLUS
INTRAVENOUS | Status: DC | PRN
  Administered 2018-08-25: 140 mg via INTRAVENOUS

## 2018-08-25 MED ORDER — FENTANYL CITRATE (PF) 100 MCG/2ML IJ SOLN
INTRAMUSCULAR | Status: DC | PRN
  Administered 2018-08-25: 100 ug via INTRAVENOUS
  Administered 2018-08-25 (×3): 50 ug via INTRAVENOUS

## 2018-08-25 MED ORDER — SODIUM CHLORIDE 0.9 % IV SOLN: 250.0000 mL | INTRAVENOUS | Status: DC

## 2018-08-25 MED ORDER — LORATADINE 10 MG PO TABS
10.0000 mg | ORAL_TABLET | Freq: Every day | ORAL | Status: DC
  Administered 2018-08-26 – 2018-08-28 (×3): 10 mg via ORAL
  Filled 2018-08-25 (×3): qty 1

## 2018-08-25 MED ORDER — LIDOCAINE-EPINEPHRINE 1 %-1:100000 IJ SOLN
INTRAMUSCULAR | Status: DC | PRN
  Administered 2018-08-25: 5 mL

## 2018-08-25 MED ORDER — SUGAMMADEX SODIUM 200 MG/2ML IV SOLN
INTRAVENOUS | Status: DC | PRN
  Administered 2018-08-25: 200 mg via INTRAVENOUS

## 2018-08-25 MED ORDER — ACETAMINOPHEN 325 MG PO TABS
650.0000 mg | ORAL_TABLET | ORAL | Status: DC | PRN
  Administered 2018-08-26 (×2): 650 mg via ORAL
  Filled 2018-08-25 (×2): qty 2

## 2018-08-25 MED ORDER — MIDAZOLAM HCL 5 MG/5ML IJ SOLN
INTRAMUSCULAR | Status: DC | PRN
  Administered 2018-08-25: 2 mg via INTRAVENOUS

## 2018-08-25 MED ORDER — PROMETHAZINE HCL 25 MG/ML IJ SOLN: 6.2500 mg | INTRAMUSCULAR | Status: DC | PRN

## 2018-08-25 MED ORDER — LACTATED RINGERS IV SOLN: INTRAVENOUS | Status: DC

## 2018-08-25 MED ORDER — SODIUM CHLORIDE 0.9 % IV SOLN
INTRAVENOUS | Status: DC
  Administered 2018-08-25: 19:00:00 via INTRAVENOUS

## 2018-08-25 MED ORDER — HYDROMORPHONE HCL 1 MG/ML IJ SOLN
0.2500 mg | INTRAMUSCULAR | Status: DC | PRN
  Administered 2018-08-25: 0.25 mg via INTRAVENOUS

## 2018-08-25 MED ORDER — SODIUM CHLORIDE 0.9% FLUSH
3.0000 mL | Freq: Two times a day (BID) | INTRAVENOUS | Status: DC
  Administered 2018-08-25 – 2018-08-26 (×2): 3 mL via INTRAVENOUS

## 2018-08-25 MED ORDER — BUPIVACAINE HCL (PF) 0.5 % IJ SOLN
INTRAMUSCULAR | Status: DC | PRN
  Administered 2018-08-25: 5 mL

## 2018-08-25 MED ORDER — HYDROMORPHONE HCL 1 MG/ML IJ SOLN
INTRAMUSCULAR | Status: AC
  Administered 2018-08-25: 18:00:00
  Filled 2018-08-25: qty 1

## 2018-08-25 MED ORDER — AZELASTINE HCL 0.1 % NA SOLN
1.0000 | Freq: Every day | NASAL | Status: DC
  Administered 2018-08-25 – 2018-08-27 (×3): 1 via NASAL
  Filled 2018-08-25: qty 30

## 2018-08-25 MED ORDER — MONTELUKAST SODIUM 10 MG PO TABS
10.0000 mg | ORAL_TABLET | Freq: Every day | ORAL | Status: DC
  Administered 2018-08-25 – 2018-08-27 (×3): 10 mg via ORAL
  Filled 2018-08-25 (×3): qty 1

## 2018-08-25 MED ORDER — OXYCODONE HCL 5 MG PO TABS
5.0000 mg | ORAL_TABLET | ORAL | Status: DC | PRN
  Administered 2018-08-25 – 2018-08-28 (×3): 5 mg via ORAL
  Filled 2018-08-25 (×3): qty 1

## 2018-08-25 MED ORDER — CHLORHEXIDINE GLUCONATE CLOTH 2 % EX PADS: 6.0000 | MEDICATED_PAD | Freq: Once | CUTANEOUS | Status: DC

## 2018-08-25 MED ORDER — HYDROMORPHONE HCL 1 MG/ML IJ SOLN
0.5000 mg | INTRAMUSCULAR | Status: DC | PRN
  Administered 2018-08-27: 0.5 mg via INTRAVENOUS
  Filled 2018-08-25: qty 0.5

## 2018-08-25 MED ORDER — CEFAZOLIN SODIUM-DEXTROSE 2-4 GM/100ML-% IV SOLN
INTRAVENOUS | Status: AC
  Filled 2018-08-25: qty 100

## 2018-08-25 MED ORDER — SODIUM CHLORIDE 0.9 % IV SOLN
INTRAVENOUS | Status: DC | PRN
  Administered 2018-08-25: 08:00:00

## 2018-08-25 MED ORDER — ACETAMINOPHEN 10 MG/ML IV SOLN
INTRAVENOUS | Status: AC
  Filled 2018-08-25: qty 100

## 2018-08-25 MED ORDER — HYDROMORPHONE HCL 1 MG/ML IJ SOLN
INTRAMUSCULAR | Status: AC
  Filled 2018-08-25: qty 0.5

## 2018-08-25 MED ORDER — LIDOCAINE-EPINEPHRINE 1 %-1:100000 IJ SOLN
INTRAMUSCULAR | Status: AC
  Filled 2018-08-25: qty 1

## 2018-08-25 MED ORDER — OXYCODONE HCL 5 MG PO TABS
10.0000 mg | ORAL_TABLET | ORAL | Status: DC | PRN
  Administered 2018-08-26 – 2018-08-28 (×9): 10 mg via ORAL
  Filled 2018-08-25 (×9): qty 2

## 2018-08-25 MED ORDER — ONDANSETRON HCL 4 MG/2ML IJ SOLN: 4.0000 mg | Freq: Four times a day (QID) | INTRAMUSCULAR | Status: DC | PRN

## 2018-08-25 MED ORDER — MIDAZOLAM HCL 2 MG/2ML IJ SOLN
INTRAMUSCULAR | Status: AC
  Filled 2018-08-25: qty 2

## 2018-08-25 MED ORDER — CEFAZOLIN SODIUM-DEXTROSE 2-4 GM/100ML-% IV SOLN
2.0000 g | INTRAVENOUS | Status: AC
  Administered 2018-08-25 (×2): 2 g via INTRAVENOUS

## 2018-08-25 MED ORDER — 0.9 % SODIUM CHLORIDE (POUR BTL) OPTIME
TOPICAL | Status: DC | PRN
  Administered 2018-08-25: 1000 mL

## 2018-08-25 MED ORDER — SERTRALINE HCL 50 MG PO TABS
50.0000 mg | ORAL_TABLET | Freq: Every day | ORAL | Status: DC
  Administered 2018-08-25 – 2018-08-27 (×3): 50 mg via ORAL
  Filled 2018-08-25 (×3): qty 1

## 2018-08-25 MED ORDER — SODIUM CHLORIDE 0.9% FLUSH: 3.0000 mL | INTRAVENOUS | Status: DC | PRN

## 2018-08-25 MED ORDER — PHENOL 1.4 % MT LIQD: 1.0000 | OROMUCOSAL | Status: DC | PRN

## 2018-08-25 MED ORDER — MENTHOL 3 MG MT LOZG: 1.0000 | LOZENGE | OROMUCOSAL | Status: DC | PRN

## 2018-08-25 MED ORDER — EPHEDRINE SULFATE 50 MG/ML IJ SOLN
INTRAMUSCULAR | Status: DC | PRN
  Administered 2018-08-25 (×3): 10 mg via INTRAVENOUS

## 2018-08-25 MED ORDER — ONDANSETRON HCL 4 MG PO TABS: 4.0000 mg | ORAL_TABLET | Freq: Four times a day (QID) | ORAL | Status: DC | PRN

## 2018-08-25 MED ORDER — DEXAMETHASONE SODIUM PHOSPHATE 10 MG/ML IJ SOLN
INTRAMUSCULAR | Status: DC | PRN
  Administered 2018-08-25: 10 mg via INTRAVENOUS

## 2018-08-25 MED ORDER — THROMBIN 5000 UNITS EX SOLR
OROMUCOSAL | Status: DC | PRN
  Administered 2018-08-25: 08:00:00 via TOPICAL

## 2018-08-25 MED ORDER — GLYCOPYRROLATE PF 0.2 MG/ML IJ SOSY
PREFILLED_SYRINGE | INTRAMUSCULAR | Status: AC
  Filled 2018-08-25: qty 3

## 2018-08-25 MED ORDER — ACETAMINOPHEN 650 MG RE SUPP: 650.0000 mg | RECTAL | Status: DC | PRN

## 2018-08-25 MED ORDER — ALPRAZOLAM 0.5 MG PO TABS
0.5000 mg | ORAL_TABLET | Freq: Two times a day (BID) | ORAL | Status: DC | PRN
  Administered 2018-08-28: 0.5 mg via ORAL
  Filled 2018-08-25: qty 1

## 2018-08-25 MED ORDER — ROCURONIUM BROMIDE 50 MG/5ML IV SOSY
PREFILLED_SYRINGE | INTRAVENOUS | Status: DC | PRN
  Administered 2018-08-25: 20 mg via INTRAVENOUS
  Administered 2018-08-25: 50 mg via INTRAVENOUS
  Administered 2018-08-25: 10 mg via INTRAVENOUS
  Administered 2018-08-25: 20 mg via INTRAVENOUS
  Administered 2018-08-25 (×2): 10 mg via INTRAVENOUS

## 2018-08-25 MED ORDER — LACTATED RINGERS IV SOLN
INTRAVENOUS | Status: DC | PRN
  Administered 2018-08-25 (×3): via INTRAVENOUS

## 2018-08-25 MED ORDER — ACETAMINOPHEN 10 MG/ML IV SOLN
INTRAVENOUS | Status: DC | PRN
  Administered 2018-08-25: 1000 mg via INTRAVENOUS

## 2018-08-25 MED ORDER — DEXAMETHASONE SODIUM PHOSPHATE 10 MG/ML IJ SOLN
INTRAMUSCULAR | Status: AC
  Filled 2018-08-25: qty 1

## 2018-08-25 MED ORDER — PANTOPRAZOLE SODIUM 40 MG PO TBEC
40.0000 mg | DELAYED_RELEASE_TABLET | Freq: Every day | ORAL | Status: DC
  Administered 2018-08-26 – 2018-08-28 (×3): 40 mg via ORAL
  Filled 2018-08-25 (×3): qty 1

## 2018-08-25 MED ORDER — BUPIVACAINE HCL (PF) 0.5 % IJ SOLN
INTRAMUSCULAR | Status: AC
  Filled 2018-08-25: qty 30

## 2018-08-25 MED ORDER — CEFAZOLIN SODIUM 1 G IJ SOLR
INTRAMUSCULAR | Status: AC
  Filled 2018-08-25: qty 20

## 2018-08-25 MED ORDER — LIDOCAINE 2% (20 MG/ML) 5 ML SYRINGE
INTRAMUSCULAR | Status: DC | PRN
  Administered 2018-08-25: 100 mg via INTRAVENOUS

## 2018-08-25 MED ORDER — ONDANSETRON HCL 4 MG/2ML IJ SOLN
INTRAMUSCULAR | Status: DC | PRN
  Administered 2018-08-25: 4 mg via INTRAVENOUS

## 2018-08-25 MED ORDER — LOSARTAN POTASSIUM 50 MG PO TABS
50.0000 mg | ORAL_TABLET | Freq: Every day | ORAL | Status: DC
  Administered 2018-08-26 – 2018-08-28 (×3): 50 mg via ORAL
  Filled 2018-08-25 (×3): qty 1

## 2018-08-25 MED ORDER — MEPERIDINE HCL 50 MG/ML IJ SOLN: 6.2500 mg | INTRAMUSCULAR | Status: DC | PRN

## 2018-08-25 MED ORDER — CEFAZOLIN SODIUM-DEXTROSE 2-4 GM/100ML-% IV SOLN
2.0000 g | Freq: Three times a day (TID) | INTRAVENOUS | Status: AC
  Administered 2018-08-25 – 2018-08-26 (×2): 2 g via INTRAVENOUS
  Filled 2018-08-25 (×2): qty 100

## 2018-08-25 MED ORDER — GLYCOPYRROLATE 0.2 MG/ML IJ SOLN
INTRAMUSCULAR | Status: DC | PRN
  Administered 2018-08-25: 0.2 mg via INTRAVENOUS

## 2018-08-25 MED ORDER — FLUTICASONE PROPIONATE 50 MCG/ACT NA SUSP
2.0000 | Freq: Every day | NASAL | Status: DC
  Administered 2018-08-25 – 2018-08-27 (×3): 2 via NASAL
  Filled 2018-08-25: qty 16

## 2018-08-25 MED ORDER — LIDOCAINE 2% (20 MG/ML) 5 ML SYRINGE
INTRAMUSCULAR | Status: AC
  Filled 2018-08-25: qty 5

## 2018-08-25 MED ORDER — ONDANSETRON HCL 4 MG/2ML IJ SOLN
INTRAMUSCULAR | Status: AC
  Filled 2018-08-25: qty 2

## 2018-08-25 SURGICAL SUPPLY — 71 items
BAG DECANTER FOR FLEXI CONT (MISCELLANEOUS) ×3 IMPLANT
BASKET BONE COLLECTION (BASKET) ×3 IMPLANT
BENZOIN TINCTURE PRP APPL 2/3 (GAUZE/BANDAGES/DRESSINGS) IMPLANT
BLADE CLIPPER SURG (BLADE) ×3 IMPLANT
BLADE SURG 11 STRL SS (BLADE) ×3 IMPLANT
BUR MATCHSTICK NEURO 3.0 LAGG (BURR) ×3 IMPLANT
BUR PRECISION FLUTE 5.0 (BURR) ×3 IMPLANT
CANISTER SUCT 3000ML PPV (MISCELLANEOUS) ×3 IMPLANT
CLOSURE WOUND 1/2 X4 (GAUZE/BANDAGES/DRESSINGS)
CONT SPEC 4OZ CLIKSEAL STRL BL (MISCELLANEOUS) ×3 IMPLANT
COROENT 8X10X30 (Cage) ×3 IMPLANT
COVER BACK TABLE 60X90IN (DRAPES) ×3 IMPLANT
DECANTER SPIKE VIAL GLASS SM (MISCELLANEOUS) ×3 IMPLANT
DERMABOND ADVANCED (GAUZE/BANDAGES/DRESSINGS) ×2
DERMABOND ADVANCED .7 DNX12 (GAUZE/BANDAGES/DRESSINGS) ×1 IMPLANT
DRAIN CHANNEL 7F 3/4 FLAT (WOUND CARE) ×3 IMPLANT
DRAPE C-ARM 42X72 X-RAY (DRAPES) ×3 IMPLANT
DRAPE C-ARMOR (DRAPES) ×3 IMPLANT
DRAPE LAPAROTOMY 100X72X124 (DRAPES) ×3 IMPLANT
DRAPE SURG 17X23 STRL (DRAPES) ×3 IMPLANT
DURAPREP 26ML APPLICATOR (WOUND CARE) ×3 IMPLANT
ELECT REM PT RETURN 9FT ADLT (ELECTROSURGICAL) ×3
ELECTRODE REM PT RTRN 9FT ADLT (ELECTROSURGICAL) ×1 IMPLANT
EVACUATOR SILICONE 100CC (DRAIN) ×3 IMPLANT
GAUZE 4X4 16PLY RFD (DISPOSABLE) IMPLANT
GAUZE SPONGE 4X4 12PLY STRL (GAUZE/BANDAGES/DRESSINGS) IMPLANT
GLOVE BIO SURGEON STRL SZ 6.5 (GLOVE) ×2 IMPLANT
GLOVE BIO SURGEON STRL SZ7.5 (GLOVE) ×6 IMPLANT
GLOVE BIO SURGEONS STRL SZ 6.5 (GLOVE) ×1
GLOVE BIOGEL PI IND STRL 6.5 (GLOVE) ×3 IMPLANT
GLOVE BIOGEL PI IND STRL 7.0 (GLOVE) ×3 IMPLANT
GLOVE BIOGEL PI IND STRL 7.5 (GLOVE) ×3 IMPLANT
GLOVE BIOGEL PI INDICATOR 6.5 (GLOVE) ×6
GLOVE BIOGEL PI INDICATOR 7.0 (GLOVE) ×6
GLOVE BIOGEL PI INDICATOR 7.5 (GLOVE) ×6
GLOVE ECLIPSE 6.5 STRL STRAW (GLOVE) ×3 IMPLANT
GLOVE EXAM NITRILE LRG STRL (GLOVE) IMPLANT
GLOVE EXAM NITRILE XL STR (GLOVE) IMPLANT
GLOVE EXAM NITRILE XS STR PU (GLOVE) IMPLANT
GLOVE SURG SS PI 6.0 STRL IVOR (GLOVE) ×12 IMPLANT
GOWN STRL REUS W/ TWL LRG LVL3 (GOWN DISPOSABLE) ×7 IMPLANT
GOWN STRL REUS W/ TWL XL LVL3 (GOWN DISPOSABLE) ×1 IMPLANT
GOWN STRL REUS W/TWL 2XL LVL3 (GOWN DISPOSABLE) IMPLANT
GOWN STRL REUS W/TWL LRG LVL3 (GOWN DISPOSABLE) ×14
GOWN STRL REUS W/TWL XL LVL3 (GOWN DISPOSABLE) ×2
HEMOSTAT POWDER KIT SURGIFOAM (HEMOSTASIS) ×3 IMPLANT
KIT BASIN OR (CUSTOM PROCEDURE TRAY) ×3 IMPLANT
KIT POSITION SURG JACKSON T1 (MISCELLANEOUS) ×3 IMPLANT
KIT TURNOVER KIT B (KITS) ×3 IMPLANT
MILL MEDIUM DISP (BLADE) IMPLANT
NEEDLE HYPO 18GX1.5 BLUNT FILL (NEEDLE) IMPLANT
NEEDLE HYPO 22GX1.5 SAFETY (NEEDLE) ×3 IMPLANT
NEEDLE SPNL 18GX3.5 QUINCKE PK (NEEDLE) IMPLANT
NS IRRIG 1000ML POUR BTL (IV SOLUTION) ×3 IMPLANT
PACK LAMINECTOMY NEURO (CUSTOM PROCEDURE TRAY) ×3 IMPLANT
PAD ARMBOARD 7.5X6 YLW CONV (MISCELLANEOUS) ×9 IMPLANT
ROD RELINE LORDOTIC 5.5X45 (Rod) ×6 IMPLANT
SCREW LOCK RELINE 5.5 TULIP (Screw) ×12 IMPLANT
SCREW RELINE-O 6.5X55 POLY (Screw) ×12 IMPLANT
SPONGE LAP 4X18 RFD (DISPOSABLE) IMPLANT
SPONGE SURGIFOAM ABS GEL 100 (HEMOSTASIS) IMPLANT
STRIP CLOSURE SKIN 1/2X4 (GAUZE/BANDAGES/DRESSINGS) IMPLANT
SUT MNCRL AB 4-0 PS2 18 (SUTURE) ×3 IMPLANT
SUT VIC AB 0 CT1 18XCR BRD8 (SUTURE) ×1 IMPLANT
SUT VIC AB 0 CT1 8-18 (SUTURE) ×2
SUT VICRYL 3-0 RB1 18 ABS (SUTURE) ×3 IMPLANT
SYR 3ML LL SCALE MARK (SYRINGE) IMPLANT
TOWEL GREEN STERILE (TOWEL DISPOSABLE) ×3 IMPLANT
TOWEL GREEN STERILE FF (TOWEL DISPOSABLE) ×3 IMPLANT
TRAY FOLEY MTR SLVR 16FR STAT (SET/KITS/TRAYS/PACK) ×3 IMPLANT
WATER STERILE IRR 1000ML POUR (IV SOLUTION) ×3 IMPLANT

## 2018-08-25 NOTE — Brief Op Note (Signed)
08/25/2018  1:50 PM  PATIENT:  Jesus Blevins  72 y.o. male  PRE-OPERATIVE DIAGNOSIS:  Lumbar radiculopathy  POST-OPERATIVE DIAGNOSIS:  lumbar radiculopathy  PROCEDURE:  Right L4-5 TLIF SURGEON:  Surgeon(s) and Role:    * Easter Kennebrew, Joyice Faster, MD - Primary    Ashok Pall, MD - Assisting  PHYSICIAN ASSISTANT:   ANESTHESIA:   general  EBL:  325 mL   BLOOD ADMINISTERED:none  DRAINS: (#7 round) Jackson-Pratt drain(s) with closed bulb suction in the subfascial space   LOCAL MEDICATIONS USED:  LIDOCAINE   SPECIMEN:  No Specimen  DISPOSITION OF SPECIMEN:  N/A  COUNTS:  YES  TOURNIQUET:  * No tourniquets in log *  DICTATION: .Note written in EPIC  PLAN OF CARE: Admit to inpatient   PATIENT DISPOSITION:  PACU - hemodynamically stable.   Delay start of Pharmacological VTE agent (>24hrs) due to surgical blood loss or risk of bleeding: yes

## 2018-08-25 NOTE — Anesthesia Procedure Notes (Signed)
Procedure Name: Intubation Date/Time: 08/25/2018 7:25 AM Performed by: Neldon Newport, CRNA Pre-anesthesia Checklist: Timeout performed, Patient being monitored, Suction available, Emergency Drugs available and Patient identified Patient Re-evaluated:Patient Re-evaluated prior to induction Oxygen Delivery Method: Circle system utilized Preoxygenation: Pre-oxygenation with 100% oxygen Induction Type: IV induction Ventilation: Mask ventilation without difficulty Laryngoscope Size: Mac and 4 Grade View: Grade I Tube type: Oral Tube size: 7.5 mm Number of attempts: 1 Placement Confirmation: breath sounds checked- equal and bilateral,  positive ETCO2 and ETT inserted through vocal cords under direct vision Secured at: 23 cm Tube secured with: Tape Dental Injury: Teeth and Oropharynx as per pre-operative assessment

## 2018-08-25 NOTE — Progress Notes (Signed)
Patient on unit, report received, 100 ml blood drained out of JP drain

## 2018-08-25 NOTE — H&P (Signed)
Surgical H&P Update  HPI: 72 y.o. man with right L4 and L5 radiculopathy that is unresponsive to non-surgcal treatment, here for L4-5 TLIF. Initial symptoms consisted of right greater than left radicular pain and radiographic workup revealed a right L4-5 disc herniation that contacts the traversing and exiting nerve roots. No changes in health since she was last seen. Still having pain and wishes to proceed with surgery.  PMHx:  Past Medical History:  Diagnosis Date  . Allergic rhinitis   . Anemia   . Anxiety   . Coronary artery disease   . Depression   . Diverticulosis   . GERD (gastroesophageal reflux disease)   . History of kidney stones   . Hypercalcemia   . Hypertension   . IBS (irritable colon syndrome)   . Osteoarthritis   . Restless leg   . Sinusitis    FamHx: History reviewed. No pertinent family history. SocHx:  reports that he has never smoked. He quit smokeless tobacco use about 16 years ago.  His smokeless tobacco use included chew. He reports that he drinks alcohol. He reports that he does not use drugs.  Physical Exam: AOx3, PERRL, FS, TM  Strength 5/5 x4, SILTx4  Assesment/Plan: 72 y.o. man with L4, L5 radiculopathies, here for TLIF. Risks, benefits, and alternatives discussed and the patient would like to continue with surgery. -OR today -3C post-op  Judith Part, MD 08/25/18 6:56 AM

## 2018-08-25 NOTE — Anesthesia Postprocedure Evaluation (Signed)
Anesthesia Post Note  Patient: TYRONN GOLDA  Procedure(s) Performed: Right Lumbar 4-5 Transforaminal lumbar interbody fusion with Lumbar 4 Bilateral Foraminotomies (Right )     Patient location during evaluation: PACU Anesthesia Type: General Level of consciousness: awake and alert Pain management: pain level controlled Vital Signs Assessment: post-procedure vital signs reviewed and stable Respiratory status: spontaneous breathing, nonlabored ventilation, respiratory function stable and patient connected to nasal cannula oxygen Cardiovascular status: blood pressure returned to baseline and stable Postop Assessment: no apparent nausea or vomiting Anesthetic complications: no    Last Vitals:  Vitals:   08/25/18 1615 08/25/18 1630  BP: 117/60 120/61  Pulse:    Resp:    Temp:    SpO2:      Last Pain:  Vitals:   08/25/18 1600  TempSrc:   PainSc: 3                  Chelsey L Woodrum

## 2018-08-25 NOTE — Transfer of Care (Signed)
Immediate Anesthesia Transfer of Care Note  Patient: Jesus Blevins  Procedure(s) Performed: Right Lumbar 4-5 Transforaminal lumbar interbody fusion with Lumbar 4 Bilateral Foraminotomies (Right )  Patient Location: PACU  Anesthesia Type:General  Level of Consciousness: awake, alert  and oriented  Airway & Oxygen Therapy: Patient Spontanous Breathing and Patient connected to nasal cannula oxygen  Post-op Assessment: Report given to RN, Post -op Vital signs reviewed and stable and Patient moving all extremities X 4  Post vital signs: Reviewed and stable  Last Vitals:  Vitals Value Taken Time  BP    Temp    Pulse 75 08/25/2018  2:07 PM  Resp 15 08/25/2018  2:07 PM  SpO2 100 % 08/25/2018  2:07 PM  Vitals shown include unvalidated device data.  Last Pain:  Vitals:   08/25/18 0559  TempSrc: Oral  PainSc: 0-No pain         Complications: No apparent anesthesia complications

## 2018-08-25 NOTE — Op Note (Signed)
PATIENT: Jesus Blevins  DATE:08/25/18   PRE-OPERATIVE DIAGNOSIS:  Lumbar radiculopathy   POST-OPERATIVE DIAGNOSIS:  Lumbar radiculopathy   PROCEDURE:  Right L4-5 transforaminal interbody fusion, bilateral L4-5 posterior pedicle instrumentation   SURGEON:  Surgeon(s) and Role:    Judith Part, MD - Primary    Ashok Pall, MD - Assisting   ANESTHESIA: ETGA   BRIEF HISTORY: This is a 72yo man who presented with left L4 and L5 radicular pain due to an L4-5 disc that contacted both the exiting and traversing nerve roots. He previously had an L4-5 microdiscectomy in the same disc space as well as L3-5 laminectomies, so a full facetectomy was required to decompress both nerve roots, necessitating instrumentation and fusion. This was discussed with the patient as well as risks, benefits, and alternatives and he wished to proceed with surgical treatment.   OPERATIVE DETAIL: The patient was taken to the operating room and placed on the OR table in the prone position. A formal time out was performed with two patient identifiers and confirmed the operative site. Anesthesia was induced by the anesthesia team. The operative site was marked, hair was clipped with surgical clippers, the area was then prepped and draped in a sterile fashion. A lumbar incision was placed in the midline through the prior incision. Dissection was performed through the prior scar to expose the residual portions of the lamina at L4 and L5 with lateral dissection to identify the transverse processes. The levels were confirmed with fluoroscopy. There were numerous bony defects in the lamina bilaterally, as expected. The right L4-5 was found to be completely fused without a visible facet joint. Fluoroscopy was therefore used to confirm the location of the L4 and L5 pedicles. The right L4-5 facet was removed using a high speed drill. The right L5 nerve root was unroofed and followed medially back to the thecal sac. There was  significant scar tissue around the nerve root both dorsally and ventrally that required a great deal of effort to remove safely. After completion of the L5 root decompression, the portion of the disc contacting the traversing L4 nerve root was identified and the disc and osteophytes were removed. An L4-5 discectomy was then performed using a combination of interbody instruments. Given the collapse of the disc space, fluoroscopy was used to place right L4 and right L5 pedicle screws, which were used to distract the disc space. An 8x10x30 5 degree Nuvasive PEEK interbody graft was sized, packed with autograft, and then inserted while retracting the L5 nerve root. Fluoroscopy was used to confirm its lateral and AP positions. The left L4 and L5 pedicle screws were then placed using fluoroscopy and then rods were inserted bilaterally with caps. All four caps were final tightened with a torque wrench. All screw lengths were 6.5x55 (Nuvasive). All instrument and sponge counts were correct, the incision was copiously irrigated and a drain was placed and then tunneled and secured. The incision was then closed in layers. The patient was then returned to anesthesia for emergence. No apparent complications at the completion of the procedure.   EBL:  289mL   DRAINS: subfascial #7 round drain with JP bulb   SPECIMENS: none   Judith Part, MD 08/25/18 1:56 PM

## 2018-08-26 ENCOUNTER — Encounter (HOSPITAL_COMMUNITY): Payer: Self-pay | Admitting: Neurological Surgery

## 2018-08-26 MED ORDER — MANAGING BACK PAIN BOOK
Freq: Once | Status: AC
  Administered 2018-08-26: 23:00:00
  Filled 2018-08-26: qty 1

## 2018-08-26 NOTE — Progress Notes (Signed)
Pt ambulated 443ft down the hallway with RN supervision. Will continue to closely monitor. Delia Heady RN

## 2018-08-26 NOTE — Evaluation (Signed)
Occupational Therapy Evaluation Patient Details Name: Jesus Blevins MRN: [id number] DOB: 07-22-1946 Today's Date: 08/26/2018    History of Present Illness Pt is a 72 y.o. M s/p right L4-5 TLIF for L4/5 nerve root decompression and bilateral posterior pedicle instrumentation. PMH signficant for but not limited to: OA, anxiety, IBS, depression, HTN.    Clinical Impression   PTA patient independent and driving.  Patient currently admitted for above and limited by pain, impaired balance, back precautions/adherance to precautions, and decreased activity tolerance. Educated on back precautions, safety, mobility, ADL compensatory techniques and recommendations. Currently completes UB ADLs with setup assist, LB ADLs with min assist (as unable to complete figure 4 technique with L LE), grooming standing with min guard, bed mobility with min guard and toilet transfers (simulated) with min guard assist.  He reports his daughter and neighbor can provide assistance at discharge if needed.  Based on performance today, [name] in order to maximize safety and independence with ADLs and mobility.     Follow Up Recommendations  Supervision - Intermittent;No OT follow up    Equipment Recommendations  None recommended by OT    Recommendations for Other Services       Precautions / Restrictions Precautions Precautions: Fall;Back Precaution Booklet Issued: Yes (comment) Precaution Comments: verbally reviewed Restrictions Weight Bearing Restrictions: No      Mobility Bed Mobility Overal bed mobility: Needs Assistance Bed Mobility: Rolling;Sidelying to Sit Rolling: Min guard Sidelying to sit: Min guard       General bed mobility comments: min guard for safety in order to adhere to back precautions and complete log roll technique  Transfers Overall transfer level: Needs assistance Equipment used: None Transfers: Sit  to/from Stand Sit to Stand: Min guard         General transfer comment: min guard for safety and balance     Balance Overall balance assessment: Mild deficits observed, not formally tested                                         ADL either performed or assessed with clinical judgement   ADL Overall ADL's : Needs assistance/impaired     Grooming: Min guard;Standing Grooming Details (indicate cue type and reason): cueing for compnesatory techniques and adherance to back precautions  Upper Body Bathing: Set up;Sitting   Lower Body Bathing: Minimal assistance;Sit to/from stand Lower Body Bathing Details (indicate cue type and reason): reviewed compensatory techniques and back precautions adherance, difficutly with figure 4 technique with L LE  Upper Body Dressing : Set up;Sitting   Lower Body Dressing: Minimal assistance;Sit to/from stand Lower Body Dressing Details (indicate cue type and reason): able to complete figure 4 technique with R LE but not L LE, min assist required  Toilet Transfer: Min guard;Ambulation(simulated in room)   Toileting- Clothing Manipulation and Hygiene: Supervision/safety;Sit to/from stand       Functional mobility during ADLs: Min guard General ADL Comments: Educated on compensatory techniques, body mechanics and back precautions in relation to ADL and daily activities.      Vision Baseline Vision/History: Wears glasses Wears Glasses: Reading only Vision Assessment?: No apparent visual deficits     Perception     Praxis      Pertinent Vitals/Pain Pain Assessment: Faces Faces Pain Scale: Hurts even more Pain Location: incisional Pain  Descriptors / Indicators: Grimacing;Operative site guarding Pain Intervention(s): Monitored during session;Patient requesting pain meds-RN notified;Limited activity within patient's tolerance     Hand Dominance     Extremity/Trunk Assessment Upper Extremity Assessment Upper Extremity  Assessment: Overall WFL for tasks assessed   Lower Extremity Assessment Lower Extremity Assessment: Defer to PT evaluation RLE Deficits / Details: 5/5 LLE Deficits / Details: 5/5   Cervical / Trunk Assessment Cervical / Trunk Assessment: Other exceptions Cervical / Trunk Exceptions: s/p spinal sx   Communication Communication Communication: No difficulties   Cognition Arousal/Alertness: Awake/alert Behavior During Therapy: WFL for tasks assessed/performed Overall Cognitive Status: Within Functional Limits for tasks assessed                                     General Comments  JP drain    Exercises     Shoulder Instructions      Home Living Family/patient expects to be discharged to:: Private residence Living Arrangements: Alone Available Help at Discharge: Family;Friend(s);Available 24 hours/day Type of Home: Mobile home Home Access: Stairs to enter Entrance Stairs-Number of Steps: 4 Entrance Stairs-Rails: Right;Left;Can reach both Home Layout: One level     Bathroom Shower/Tub: Teacher, early years/pre: Standard     Home Equipment: Cane - single point;Shower seat;Adaptive equipment Adaptive Equipment: Reacher;Sock aid;Long-handled shoe horn;Long-handled sponge        Prior Functioning/Environment Level of Independence: Independent        Comments: independent and driving. Used to work as a Pension scheme manager. Enjoys making canes as a hobby        OT Problem List: Decreased activity tolerance;Impaired balance (sitting and/or standing);Decreased knowledge of use of DME or AE;Decreased knowledge of precautions;Pain      OT Treatment/Interventions: Self-care/ADL training;Energy conservation;DME and/or AE instruction;Therapeutic activities;Patient/family education;Balance training    OT Goals(Current goals can be found in the care plan section) Acute Rehab OT Goals Patient Stated Goal: "walk more to get better balance." OT Goal  Formulation: With patient Time For Goal Achievement: 09/09/18 Potential to Achieve Goals: Good  OT Frequency: Min 2X/week   Barriers to D/C:            Co-evaluation              AM-PAC PT "6 Clicks" Daily Activity     Outcome Measure Help from another person eating meals?: None Help from another person taking care of personal grooming?: A Little Help from another person toileting, which includes using toliet, bedpan, or urinal?: None Help from another person bathing (including washing, rinsing, drying)?: A Little Help from another person to put on and taking off regular upper body clothing?: None Help from another person to put on and taking off regular lower body clothing?: A Little 6 Click Score: 21   End of Session Equipment Utilized During Treatment: Gait belt  Activity Tolerance: Patient tolerated treatment well Patient left: with call bell/phone within reach;Other (comment)(seated EOB, handoff to PT)  OT Visit Diagnosis: Other abnormalities of gait and mobility (R26.89);Pain Pain - part of body: (back)                Time: 6144-3154 OT Time Calculation (min): 22 min Charges:  OT General Charges $OT Visit: 1 Visit OT Evaluation $OT Eval Low Complexity: [place], OT Acute Rehabilitation Services Pager [phone] Office [phone]   Delight Stare 08/26/2018, 10:43 AM

## 2018-08-26 NOTE — Progress Notes (Addendum)
Neurosurgery Service Progress Note  Subjective: No acute events overnight. Right leg pain completely resolved after surgery, back pain moderate in intensity, doing well   Objective: Vitals:   08/25/18 1826 08/25/18 1946 08/26/18 0022 08/26/18 0328  BP: 116/64 106/60 112/64 117/64  Pulse: 60 (!) 58 61 (!) 58  Resp: 20 20 20 20   Temp: 97.6 F (36.4 C) 98.3 F (36.8 C) 98.6 F (37 C) 98.2 F (36.8 C)  TempSrc: Oral Oral Oral Oral  SpO2: 97% 96% 93% 96%  Weight:      Height:       Temp (24hrs), Avg:98.1 F (36.7 C), Min:97.6 F (36.4 C), Max:98.6 F (37 C)  CBC Latest Ref Rng & Units 08/25/2018 11/03/2017 04/18/2016  WBC 4.0 - 10.5 K/uL 7.1 7.8 10.2  Hemoglobin 13.0 - 17.0 g/dL 13.6 13.7 11.0(L)  Hematocrit 39.0 - 52.0 % 40.6 39.7(L) 30.7(L)  Platelets 150 - 400 K/uL 173 184 135(L)   BMP Latest Ref Rng & Units 08/25/2018 11/03/2017 04/17/2016  Glucose 70 - 99 mg/dL 101(H) 92 119(H)  BUN 8 - 23 mg/dL 15 18 14   Creatinine 0.61 - 1.24 mg/dL 0.77 0.82 0.89  Sodium 135 - 145 mmol/L 140 137 139  Potassium 3.5 - 5.1 mmol/L 3.8 4.0 3.8  Chloride 98 - 111 mmol/L 108 104 106  CO2 22 - 32 mmol/L 23 25 26   Calcium 8.9 - 10.3 mg/dL 9.4 9.6 8.6(L)    Intake/Output Summary (Last 24 hours) at 08/26/2018 0801 Last data filed at 08/26/2018 0535 Gross per 24 hour  Intake 4097.05 ml  Output 4070 ml  Net 27.05 ml    Current Facility-Administered Medications:  .  0.9 %  sodium chloride infusion, 250 mL, Intravenous, Continuous, Chauncy Mangiaracina A, MD .  0.9 %  sodium chloride infusion, , Intravenous, Continuous, Mattilyn Crites, Joyice Faster, MD, Last Rate: 75 mL/hr at 08/25/18 1858 .  acetaminophen (TYLENOL) tablet 650 mg, 650 mg, Oral, Q4H PRN **OR** acetaminophen (TYLENOL) suppository 650 mg, 650 mg, Rectal, Q4H PRN, Judith Part, MD .  ALPRAZolam Duanne Moron) tablet 0.5 mg, 0.5 mg, Oral, BID PRN, Judith Part, MD .  atorvastatin (LIPITOR) tablet 40 mg, 40 mg, Oral, QHS, Kiandra Sanguinetti  A, MD, 40 mg at 08/25/18 2125 .  azelastine (ASTELIN) 0.1 % nasal spray 1 spray, 1 spray, Each Nare, QHS, Judith Part, MD, 1 spray at 08/25/18 2126 .  fluticasone (FLONASE) 50 MCG/ACT nasal spray 2 spray, 2 spray, Each Nare, Daily, Judith Part, MD, 2 spray at 08/25/18 2005 .  HYDROmorphone (DILAUDID) injection 0.5 mg, 0.5 mg, Intravenous, Q2H PRN, Judith Part, MD .  lactated ringers infusion, , Intravenous, Continuous, Nolon Nations, MD .  loratadine (CLARITIN) tablet 10 mg, 10 mg, Oral, Daily, Kadon Andrus A, MD .  losartan (COZAAR) tablet 50 mg, 50 mg, Oral, Daily, Drezden Seitzinger A, MD .  menthol-cetylpyridinium (CEPACOL) lozenge 3 mg, 1 lozenge, Oral, PRN **OR** phenol (CHLORASEPTIC) mouth spray 1 spray, 1 spray, Mouth/Throat, PRN, Deadra Diggins A, MD .  montelukast (SINGULAIR) tablet 10 mg, 10 mg, Oral, QHS, Wanetta Funderburke, Joyice Faster, MD, 10 mg at 08/25/18 2125 .  ondansetron (ZOFRAN) tablet 4 mg, 4 mg, Oral, Q6H PRN **OR** ondansetron (ZOFRAN) injection 4 mg, 4 mg, Intravenous, Q6H PRN, Zeba Luby A, MD .  oxyCODONE (Oxy IR/ROXICODONE) immediate release tablet 10 mg, 10 mg, Oral, Q3H PRN, Judith Part, MD, 10 mg at 08/26/18 0139 .  oxyCODONE (Oxy IR/ROXICODONE) immediate release tablet 5 mg, 5  mg, Oral, Q3H PRN, Judith Part, MD, 5 mg at 08/25/18 2132 .  pantoprazole (PROTONIX) EC tablet 40 mg, 40 mg, Oral, Daily, Lamount Bankson A, MD .  sertraline (ZOLOFT) tablet 50 mg, 50 mg, Oral, QHS, Admir Candelas, Joyice Faster, MD, 50 mg at 08/25/18 2126 .  sodium chloride flush (NS) 0.9 % injection 3 mL, 3 mL, Intravenous, Q12H, Bethany Cumming, Joyice Faster, MD, 3 mL at 08/25/18 2127 .  sodium chloride flush (NS) 0.9 % injection 3 mL, 3 mL, Intravenous, PRN, Judith Part, MD   Physical Exam: AOx3, PERRL, EOMI, FS, TM, Strength 5/5 x4, SILTx4  Assessment & Plan: 72 y.o. man s/p right L4-5 TLIF for L4/5 nerve root decompression and bilateral posterior  pedicle instrumentation, recovering well. -RLE radicular pain completely resolved -PT/OT today, ambulate as tolerated -disposition pending PT/OT evals -continue drain, output downtrending -d/c foley -SQH POD2  Judith Part  08/26/18 8:01 AM

## 2018-08-26 NOTE — Evaluation (Signed)
Physical Therapy Evaluation Patient Details Name: Jesus Blevins MRN: [id number] DOB: [birth year] Today's Date: 08/26/2018   History of Present Illness  Pt is a 72 y.o. M s/p right L4-5 TLIF for L4/5 nerve root decompression and bilateral posterior pedicle instrumentation  Clinical Impression  Patient is s/p above surgery resulting in the deficits listed below (see PT Problem List). Patient ambulating 200 feet with and without a cane with min guard assistance for balance. Reviewed spinal precautions with ADL's; needs occasional reminders for compliance. Patient will benefit from skilled PT to increase their independence and safety with mobility (while adhering to their precautions) to allow discharge to the venue listed below.     Follow Up Recommendations No PT follow up;Supervision for mobility/OOB    Equipment Recommendations  None recommended by PT    Recommendations for Other Services       Precautions / Restrictions Precautions Precautions: Fall;Back Precaution Booklet Issued: Yes (comment) Precaution Comments: verbally reviewed; OT provided written handout Restrictions Weight Bearing Restrictions: No      Mobility  Bed Mobility               General bed mobility comments: Seated EOB on arrival  Transfers Overall transfer level: Needs assistance Equipment used: None Transfers: Sit to/from Stand Sit to Stand: Min guard            Ambulation/Gait Ambulation/Gait assistance: Min guard Gait Distance (Feet): 200 Feet Assistive device: None;Straight cane Gait Pattern/deviations: Decreased stride length;Step-through pattern;Decreased step length - left Gait velocity: decr   General Gait Details: Increased gait speed with increased ambulation distance. Mild unsteadiness throughout. Somewhat improved with use of cane  Stairs            Wheelchair Mobility    Modified Rankin (Stroke Patients Only)       Balance Overall balance assessment: Mild  deficits observed, not formally tested                                           Pertinent Vitals/Pain Pain Assessment: Faces Faces Pain Scale: Hurts little more Pain Location: incisional Pain Descriptors / Indicators: Grimacing;Operative site guarding Pain Intervention(s): Monitored during session    Home Living Family/patient expects to be discharged to:: Private residence Living Arrangements: Alone   Type of Home: Mobile home Home Access: Stairs to enter Entrance Stairs-Rails: Right;Left;Can reach both Technical brewer of Steps: 4 Home Layout: One level Home Equipment: Cane - single point      Prior Function Level of Independence: Independent         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     Journalist, newspaper        Extremity/Trunk Assessment   Upper Extremity Assessment Upper Extremity Assessment: Defer to OT evaluation    Lower Extremity Assessment Lower Extremity Assessment: RLE deficits/detail;LLE deficits/detail RLE Deficits / Details: 5/5 LLE Deficits / Details: 5/5    Cervical / Trunk Assessment Cervical / Trunk Assessment: Other exceptions Cervical / Trunk Exceptions: s/p spinal sx  Communication   Communication: No difficulties  Cognition Arousal/Alertness: Awake/alert Behavior During Therapy: WFL for tasks assessed/performed Overall Cognitive Status: Within Functional Limits for tasks assessed  General Comments General comments (skin integrity, edema, etc.): JP drain    Exercises     Assessment/Plan    PT Assessment Patient needs continued PT services  PT Problem List Decreased balance;Decreased mobility;Decreased knowledge of precautions;Pain       PT Treatment Interventions DME instruction;Gait training;Stair training;Functional mobility training;Therapeutic activities;Therapeutic exercise;Balance  training;Patient/family education    PT Goals (Current goals can be found in the Care Plan section)  Acute Rehab PT Goals Patient Stated Goal: "walk more to get better balance." PT Goal Formulation: With patient Time For Goal Achievement: 08/31/18 Potential to Achieve Goals: Good    Frequency Min 5X/week   Barriers to discharge        Co-evaluation               AM-PAC PT "6 Clicks" Daily Activity  Outcome Measure Difficulty turning over in bed (including adjusting bedclothes, sheets and blankets)?: None Difficulty moving from lying on back to sitting on the side of the bed? : A Little Difficulty sitting down on and standing up from a chair with arms (e.g., wheelchair, bedside commode, etc,.)?: A Little Help needed moving to and from a bed to chair (including a wheelchair)?: A Little Help needed walking in hospital room?: A Little Help needed climbing 3-5 steps with a railing? : A Little 6 Click Score: 19    End of Session Equipment Utilized During Treatment: Gait belt Activity Tolerance: Patient tolerated treatment well Patient left: in chair;with call bell/phone within reach Nurse Communication: Mobility status PT Visit Diagnosis: Unsteadiness on feet (R26.81);Difficulty in walking, not elsewhere classified (R26.2);Pain Pain - part of body: (back)    Time: 8811-0315 PT Time Calculation (min) (ACUTE ONLY): 15 min   Charges:   PT Evaluation $PT Eval Low Complexity: 1 Low          Ellamae Sia, PT, DPT Acute Rehabilitation Services Pager [phone] Office [phone]   Willy Eddy 08/26/2018, 9:10 AM

## 2018-08-27 ENCOUNTER — Other Ambulatory Visit: Payer: Self-pay

## 2018-08-27 MED ORDER — CYCLOBENZAPRINE HCL 10 MG PO TABS: 10.0000 mg | ORAL_TABLET | Freq: Three times a day (TID) | ORAL | 0 refills | Status: DC | PRN

## 2018-08-27 MED ORDER — OXYCODONE HCL 5 MG PO TABS: 5.0000 mg | ORAL_TABLET | ORAL | 0 refills | Status: AC | PRN

## 2018-08-27 MED ORDER — CYCLOBENZAPRINE HCL 10 MG PO TABS
10.0000 mg | ORAL_TABLET | Freq: Three times a day (TID) | ORAL | Status: DC | PRN
  Administered 2018-08-27: 10 mg via ORAL
  Filled 2018-08-27 (×2): qty 1

## 2018-08-27 MED ORDER — HEPARIN SODIUM (PORCINE) 5000 UNIT/ML IJ SOLN
5000.0000 [IU] | Freq: Three times a day (TID) | INTRAMUSCULAR | Status: DC
  Administered 2018-08-27 – 2018-08-28 (×4): 5000 [IU] via SUBCUTANEOUS
  Filled 2018-08-27 (×4): qty 1

## 2018-08-27 NOTE — Progress Notes (Addendum)
Occupational Therapy Treatment Patient Details Name: Jesus Blevins MRN: [id number] DOB: Jul 07, 1946 Today's Date: 08/27/2018    History of present illness Pt is a 72 y.o. M s/p right L4-5 TLIF for L4/5 nerve root decompression and bilateral posterior pedicle instrumentation. PMH signficant for but not limited to: OA, anxiety, IBS, depression, HTN.    OT comments  Patient progressing well.  Continues to be limited by incisional pain in back and demonstrates preference to use RW for mobility today.  Completes toilet transfers with supervision and tub transfers with min guard assist.  Agreealbe to have supervision for bathing and tub transfers initially at dc.  Reviewed AE verbally and patient voices understanding, as he has utilized them before as needed.  Reviewed precautions, safety, and energy conservation. Plan for dc home today. No further questions or concerns. Will continue to follow.    Follow Up Recommendations  Supervision - Intermittent;No OT follow up    Equipment Recommendations  None recommended by OT    Recommendations for Other Services      Precautions / Restrictions Precautions Precautions: Fall;Back Precaution Booklet Issued: Yes (comment) Precaution Comments: verbally reviewed Restrictions Weight Bearing Restrictions: No       Mobility Bed Mobility               General bed mobility comments: seated OOB in recliner   Transfers Overall transfer level: Needs assistance Equipment used: None Transfers: Sit to/from Stand Sit to Stand: Supervision         General transfer comment: good technique and hand placement, supervision for safety    Balance Overall balance assessment: Mild deficits observed, not formally tested                                         ADL either performed or assessed with clinical judgement   ADL Overall ADL's : Needs assistance/impaired             Lower Body Bathing: Supervison/ safety;Sit to/from  stand;Cueing for compensatory techniques;Adhering to back precautions Lower Body Bathing Details (indicate cue type and reason): reviewed bathing seated with supervision for safety, using long sponge to reach feet       Lower Body Dressing Details (indicate cue type and reason): reviewed AE and compensatory techniques as needed Toilet Transfer: Supervision/safety;Ambulation;RW(simulated to recliner )       Tub/ Shower Transfer: Tub transfer;Min guard;Ambulation;Shower Scientist, research (medical) Details (indicate cue type and reason): educated on safe technqiue, min guard to step over tub threshold for safety Functional mobility during ADLs: Supervision/safety;Rolling walker(pt preference to use RW this morning) General ADL Comments: reviewed back precautions and compensatory techniques      Vision       Perception     Praxis      Cognition Arousal/Alertness: Awake/alert Behavior During Therapy: WFL for tasks assessed/performed Overall Cognitive Status: Within Functional Limits for tasks assessed                                          Exercises     Shoulder Instructions       General Comments      Pertinent Vitals/ Pain       Pain Assessment: Faces Faces Pain Scale: Hurts even more Pain Location: incisional Pain Descriptors / Indicators: Grimacing;Operative site  guarding Pain Intervention(s): Limited activity within patient's tolerance;Repositioned  Home Living                                          Prior Functioning/Environment              Frequency  Min 2X/week        Progress Toward Goals  OT Goals(current goals can now be found in the care plan section)  Progress towards OT goals: Progressing toward goals  Acute Rehab OT Goals Patient Stated Goal: "walk more to get better balance." OT Goal Formulation: With patient Time For Goal Achievement: 09/09/18 Potential to Achieve Goals: Good  Plan Discharge plan  remains appropriate;Frequency remains appropriate    Co-evaluation                 AM-PAC PT "6 Clicks" Daily Activity     Outcome Measure   Help from another person eating meals?: None Help from another person taking care of personal grooming?: None Help from another person toileting, which includes using toliet, bedpan, or urinal?: None Help from another person bathing (including washing, rinsing, drying)?: None Help from another person to put on and taking off regular upper body clothing?: None Help from another person to put on and taking off regular lower body clothing?: None 6 Click Score: 24    End of Session Equipment Utilized During Treatment: Gait belt;Rolling walker  OT Visit Diagnosis: Other abnormalities of gait and mobility (R26.89);Pain Pain - part of body: (back incision)   Activity Tolerance Patient tolerated treatment well   Patient Left with call bell/phone within reach;in chair   Nurse Communication Mobility status        Time: 1224-4975 OT Time Calculation (min): 12 min  Charges: OT General Charges $OT Visit: 1 Visit OT Treatments $Self Care/Home Management : 8-22 mins  Delight Stare, [place] Pager [phone] Office [phone]    Delight Stare 08/27/2018, 9:20 AM

## 2018-08-27 NOTE — Discharge Instructions (Addendum)
Discharge Instructions  No restriction in activities, slowly increase your activity back to normal.   Your incision is closed with dermabond (purple glue). This will naturally fall off over the next 1-2 weeks.   Okay to shower on the day of discharge. Be gentle when cleaning your incision. Use regular soap and water. If that is uncomfortable, try using baby shampoo. Do not submerge the wound under water for 2 weeks after surgery.

## 2018-08-27 NOTE — Progress Notes (Signed)
Patient ambulating 400" well with walker in hallway.  Pain not controlled with Oxy IR 10mg .  Administered IV Dilauid  0.5mg  with relief.  JP draining 120cc.

## 2018-08-27 NOTE — Plan of Care (Signed)

## 2018-08-27 NOTE — Progress Notes (Signed)
Physical Therapy Treatment Patient Details Name: Jesus Blevins MRN: [id number] DOB: Mar 18, 1946 Today's Date: 08/27/2018    History of Present Illness Pt is a 72 y.o. M s/p right L4-5 TLIF for L4/5 nerve root decompression and bilateral posterior pedicle instrumentation. PMH signficant for but not limited to: OA, anxiety, IBS, depression, HTN.     PT Comments    Patient continues to progress toward PT goals and tolerated session. Pt is able to recall 3/3 precautions. Current plan remains appropriate.    Follow Up Recommendations  No PT follow up;Supervision for mobility/OOB     Equipment Recommendations  None recommended by PT    Recommendations for Other Services       Precautions / Restrictions Precautions Precautions: Fall;Back Precaution Comments: pt recalled 3/3 precautions    Mobility  Bed Mobility               General bed mobility comments: OOB in chair upon arrival  Transfers Overall transfer level: Needs assistance Equipment used: None Transfers: Sit to/from Stand Sit to Stand: Supervision         General transfer comment: supervision for safety  Ambulation/Gait Ambulation/Gait assistance: Supervision Gait Distance (Feet): 250 Feet Assistive device: Rolling walker (2 wheeled);Straight cane Gait Pattern/deviations: Step-through pattern;Decreased stride length;Trunk flexed Gait velocity: decreased   General Gait Details: cues for posture; pt used RW half of time and SPC the rest; no LOB    Stairs Stairs: Yes Stairs assistance: Supervision Stair Management: Two rails;Alternating pattern;Forwards Number of Stairs: 2     Wheelchair Mobility    Modified Rankin (Stroke Patients Only)       Balance Overall balance assessment: Mild deficits observed, not formally tested                                          Cognition Arousal/Alertness: Awake/alert Behavior During Therapy: WFL for tasks assessed/performed Overall  Cognitive Status: Within Functional Limits for tasks assessed                                        Exercises      General Comments General comments (skin integrity, edema, etc.): JP drain      Pertinent Vitals/Pain Pain Assessment: Faces Faces Pain Scale: Hurts little more Pain Location: incisional and R hip Pain Descriptors / Indicators: Grimacing;Guarding;Sore Pain Intervention(s): Limited activity within patient's tolerance;Monitored during session;Premedicated before session    Home Living                      Prior Function            PT Goals (current goals can now be found in the care plan section) Progress towards PT goals: Progressing toward goals    Frequency    Min 5X/week      PT Plan Current plan remains appropriate    Co-evaluation              AM-PAC PT "6 Clicks" Daily Activity  Outcome Measure  Difficulty turning over in bed (including adjusting bedclothes, sheets and blankets)?: None Difficulty moving from lying on back to sitting on the side of the bed? : A Little Difficulty sitting down on and standing up from a chair with arms (e.g., wheelchair, bedside commode, etc,.)?: A Little Help  needed moving to and from a bed to chair (including a wheelchair)?: A Little Help needed walking in hospital room?: A Little Help needed climbing 3-5 steps with a railing? : A Little 6 Click Score: 19    End of Session Equipment Utilized During Treatment: Gait belt Activity Tolerance: Patient tolerated treatment well Patient left: with call bell/phone within reach;Other (comment)(pt in restroom) Nurse Communication: Mobility status PT Visit Diagnosis: Unsteadiness on feet (R26.81);Difficulty in walking, not elsewhere classified (R26.2);Pain Pain - part of body: (back)     Time: 2194-7125 PT Time Calculation (min) (ACUTE ONLY): 15 min  Charges:  $Gait Training: 8-22 mins                     Earney Navy, PTA Acute  Rehabilitation Services Pager: [phone] Office: [phone]     Darliss Cheney 08/27/2018, 3:00 PM

## 2018-08-27 NOTE — Progress Notes (Signed)
Discussed with bedside nurse. She noticed that the patient's drain output was not present in the I/O charting section when I rounded this morning. His overnight output was greater than the 50cc that I saw when I rounded this morning. We will therefore keep the drain in another day and plan with drain removal and discharge tomorrow if his output continues to trend down to an acceptable value. This was excellent nursing, thank you for helping with the care of our patient.

## 2018-08-27 NOTE — Progress Notes (Signed)
Neurosurgery Service Progress Note  Subjective: No acute events overnight. Right leg pain completely resolved after surgery, back pain moderate in intensity, ambulating well with PT/OT and nursing  Objective: Vitals:   08/26/18 1821 08/26/18 1924 08/26/18 2341 08/27/18 0412  BP: (!) 136/59 126/63 (!) 116/58 113/60  Pulse: (!) 54 (!) 54 64 71  Resp: 18 18 18 18   Temp: 97.8 F (36.6 C) 98 F (36.7 C) 99.3 F (37.4 C) 98.5 F (36.9 C)  TempSrc: Oral Oral Oral Oral  SpO2: 96% 97% 94% 96%  Weight:      Height:       Temp (24hrs), Avg:98.3 F (36.8 C), Min:97.8 F (36.6 C), Max:99.3 F (37.4 C)  CBC Latest Ref Rng & Units 08/25/2018 11/03/2017 04/18/2016  WBC 4.0 - 10.5 K/uL 7.1 7.8 10.2  Hemoglobin 13.0 - 17.0 g/dL 13.6 13.7 11.0(L)  Hematocrit 39.0 - 52.0 % 40.6 39.7(L) 30.7(L)  Platelets 150 - 400 K/uL 173 184 135(L)   BMP Latest Ref Rng & Units 08/25/2018 11/03/2017 04/17/2016  Glucose 70 - 99 mg/dL 101(H) 92 119(H)  BUN 8 - 23 mg/dL 15 18 14   Creatinine 0.61 - 1.24 mg/dL 0.77 0.82 0.89  Sodium 135 - 145 mmol/L 140 137 139  Potassium 3.5 - 5.1 mmol/L 3.8 4.0 3.8  Chloride 98 - 111 mmol/L 108 104 106  CO2 22 - 32 mmol/L 23 25 26   Calcium 8.9 - 10.3 mg/dL 9.4 9.6 8.6(L)    Intake/Output Summary (Last 24 hours) at 08/27/2018 0820 Last data filed at 08/27/2018 0200 Gross per 24 hour  Intake 2851.25 ml  Output 1585 ml  Net 1266.25 ml    Current Facility-Administered Medications:  .  0.9 %  sodium chloride infusion, 250 mL, Intravenous, Continuous, Zylee Marchiano A, MD .  0.9 %  sodium chloride infusion, , Intravenous, Continuous, Laneshia Pina, Joyice Faster, MD, Stopped at 08/27/18 0200 .  acetaminophen (TYLENOL) tablet 650 mg, 650 mg, Oral, Q4H PRN, 650 mg at 08/26/18 1336 **OR** acetaminophen (TYLENOL) suppository 650 mg, 650 mg, Rectal, Q4H PRN, Judith Part, MD .  ALPRAZolam Duanne Moron) tablet 0.5 mg, 0.5 mg, Oral, BID PRN, Judith Part, MD .  atorvastatin  (LIPITOR) tablet 40 mg, 40 mg, Oral, QHS, Sheikh Leverich A, MD, 40 mg at 08/26/18 2030 .  azelastine (ASTELIN) 0.1 % nasal spray 1 spray, 1 spray, Each Nare, QHS, Judith Part, MD, 1 spray at 08/26/18 2031 .  fluticasone (FLONASE) 50 MCG/ACT nasal spray 2 spray, 2 spray, Each Nare, Daily, Judith Part, MD, 2 spray at 08/26/18 [phone] .  HYDROmorphone (DILAUDID) injection 0.5 mg, 0.5 mg, Intravenous, Q2H PRN, Judith Part, MD, 0.5 mg at 08/27/18 0203 .  lactated ringers infusion, , Intravenous, Continuous, Nolon Nations, MD .  loratadine (CLARITIN) tablet 10 mg, 10 mg, Oral, Daily, Judith Part, MD, 10 mg at 08/26/18 0835 .  losartan (COZAAR) tablet 50 mg, 50 mg, Oral, Daily, Judith Part, MD, 50 mg at 08/26/18 0835 .  menthol-cetylpyridinium (CEPACOL) lozenge 3 mg, 1 lozenge, Oral, PRN **OR** phenol (CHLORASEPTIC) mouth spray 1 spray, 1 spray, Mouth/Throat, PRN, Bear Osten A, MD .  montelukast (SINGULAIR) tablet 10 mg, 10 mg, Oral, QHS, Palmer Fahrner, Joyice Faster, MD, 10 mg at 08/26/18 2030 .  ondansetron (ZOFRAN) tablet 4 mg, 4 mg, Oral, Q6H PRN **OR** ondansetron (ZOFRAN) injection 4 mg, 4 mg, Intravenous, Q6H PRN, Raaga Maeder A, MD .  oxyCODONE (Oxy IR/ROXICODONE) immediate release tablet 10 mg, 10 mg, Oral,  Q3H PRN, Judith Part, MD, 10 mg at 08/26/18 2344 .  oxyCODONE (Oxy IR/ROXICODONE) immediate release tablet 5 mg, 5 mg, Oral, Q3H PRN, Judith Part, MD, 5 mg at 08/26/18 1601 .  pantoprazole (PROTONIX) EC tablet 40 mg, 40 mg, Oral, Daily, Judith Part, MD, 40 mg at 08/26/18 0835 .  sertraline (ZOLOFT) tablet 50 mg, 50 mg, Oral, QHS, Alvia Jablonski, Joyice Faster, MD, 50 mg at 08/26/18 2030 .  sodium chloride flush (NS) 0.9 % injection 3 mL, 3 mL, Intravenous, Q12H, Jodean Valade, Joyice Faster, MD, 3 mL at 08/26/18 0837 .  sodium chloride flush (NS) 0.9 % injection 3 mL, 3 mL, Intravenous, PRN, Judith Part, MD   Physical Exam: AOx3,  PERRL, EOMI, FS, TM, Strength 5/5 x4, SILTx4 Incision c/d/i  Assessment & Plan: 72 y.o. man s/p right L4-5 TLIF for L4/5 nerve root decompression and bilateral posterior pedicle instrumentation, recovering well. -d/c drain, 65cc last shift -discharge home today -PT/OT no needs -continue ambulation -start SQH, cont SCDs/TEDs  Judith Part  08/27/18 8:20 AM

## 2018-08-27 NOTE — Care Management Note (Signed)
Case Management Note  Patient Details  Name: Jesus Blevins MRN: [id number] Date of Birth: Dec 06, 1946  Subjective/Objective:    Pt s/p lumbar fusion. He is from home with his spouse.                Action/Plan: Pt discharging home with self care. PT/OT with no f/u and no DME needs. Pt has transportation home.  Expected Discharge Date:  08/27/18               Expected Discharge Plan:  Home/Self Care  In-House Referral:     Discharge planning Services     Post Acute Care Choice:    Choice offered to:     DME Arranged:    DME Agency:     HH Arranged:    HH Agency:     Status of Service:  Completed, signed off  If discussed at [employer] of Stay Meetings, dates discussed:    Additional Comments:  Pollie Friar, RN 08/27/2018, 10:12 AM

## 2018-08-27 NOTE — Discharge Summary (Addendum)
Discharge Summary  Date of Admission: 08/25/2018  Date of Discharge: 08/28/18  Attending Physician: Emelda Brothers, MD  Hospital Course: Patient was admitted following an uncomplicated D7-4 TLIF with L4-5 pedicle instrumented fusion. He was recovered in PACU and transferred to a regular nursing floor. His pre-operative radicular pain was completely resolved immediately after surgery and did not recur. His hospital course was uncomplicated and the patient was discharged home on 08/28/2018. He will follow up in clinic with me in 2 weeks.  Neurologic exam at discharge:  AOx3, PERRL, EOMI, FS, TM Strength 5/5 x4, SILTx4, no drift  Judith Part, MD 08/27/18 8:26 AM

## 2018-08-27 NOTE — Progress Notes (Signed)
Called Dr Zada Finders regarding the amount of drainage from [place]. New order to keep JP in for now and cancel discharge for today.

## 2018-08-28 NOTE — Progress Notes (Signed)
NURSING PROGRESS NOTE  Jesus Blevins [id number] Discharge Data: 08/28/2018 6:59 PM Attending Provider: No att. providers found [id number], Tonette Bihari, MD     Marlyne Beards to be D/C'd Home per MD order.  Discussed with the patient the After Visit Summary and all questions fully answered. All IV's discontinued with no bleeding noted. All belongings returned to patient for patient to take home.   Last Vital Signs:  Blood pressure (!) 142/76, pulse (!) 54, temperature 97.7 F (36.5 C), temperature source Oral, resp. rate 16, height 5\' 10"  (1.778 m), weight 90.7 kg, SpO2 100 %.  Discharge Medication List Allergies as of 08/28/2018      Reactions   Elavil [amitriptyline] Anxiety   Sulfa Antibiotics Rash   Childhood allergy      Medication List    STOP taking these medications   aspirin 81 MG tablet     TAKE these medications   ALPRAZolam 0.5 MG tablet Commonly known as:  XANAX Take 0.5 mg 2 (two) times daily as needed by mouth for anxiety (takes 1 dose every morning and another dose only if needed).   AMINO ACID PO Take 1 tablet by mouth daily.   amoxicillin 125 MG chewable tablet Commonly known as:  AMOXIL Chew 125 mg by mouth See admin instructions. Takes 1 capsule 125 mg 1 hour prior to denal procedure   atorvastatin 40 MG tablet Commonly known as:  LIPITOR Take 40 mg by mouth at bedtime.   azelastine 0.1 % nasal spray Commonly known as:  ASTELIN Place 1 spray at bedtime into both nostrils. Use in each nostril as directed   B-complex with vitamin C tablet Take 1 tablet by mouth daily.   Biotin 10000 MCG Tabs Take 10,000 mcg daily by mouth.   Calcium-Magnesium-Zinc-D3 Tabs Take 1 tablet by mouth at bedtime.   Cholecalciferol 2000 units Caps Take 2,000 Units daily by mouth.   CO Q 10 PO Take 200 mg daily by mouth.   cyclobenzaprine 10 MG tablet Commonly known as:  FLEXERIL Take 1 tablet (10 mg total) by mouth 3 (three) times daily as needed for muscle  spasms.   fexofenadine 180 MG tablet Commonly known as:  ALLEGRA Take 180 mg by mouth daily.   FISH OIL-FLAX OIL-BORAGE OIL PO Take 1 capsule 2 (two) times daily by mouth. 800 mg fish oil  800 mg flaxseed oil 400 mg Borage oil   fluticasone 50 MCG/ACT nasal spray Commonly known as:  FLONASE Place 2 sprays daily into both nostrils.   GLUCOSAMINE CHONDR 1500 COMPLX Caps Take 1 capsule by mouth 2 (two) times daily. Cosamine ASU   losartan 50 MG tablet Commonly known as:  COZAAR Take 50 mg by mouth daily.   LUTEIN 20 Caps Take 20 mg daily by mouth.   montelukast 10 MG tablet Commonly known as:  SINGULAIR Take 10 mg by mouth at bedtime.   multivitamin with minerals Tabs tablet Take 0.5 tablets by mouth 2 (two) times daily.   omeprazole 40 MG capsule Commonly known as:  PRILOSEC Take 20 mg by mouth daily as needed (when traveling).   ondansetron 4 MG tablet Commonly known as:  ZOFRAN Take 1 tablet (4 mg total) by mouth every 8 (eight) hours as needed for nausea or vomiting.   oxyCODONE 5 MG immediate release tablet Commonly known as:  Oxy IR/ROXICODONE Take 1 tablet (5 mg total) by mouth every 4 (four) hours as needed for moderate pain (pain). What changed:  reasons  to take this   ranitidine 150 MG tablet Commonly known as:  ZANTAC Take 150 mg 3 (three) times daily as needed by mouth for heartburn (takes 2 times daily and a 3rd dose if needed).   sertraline 50 MG tablet Commonly known as:  ZOLOFT Take 50 mg by mouth at bedtime.

## 2018-08-28 NOTE — Progress Notes (Signed)
NURSING PROGRESS NOTE  Jesus Blevins [id number] Discharge Data: 08/28/2018 12:45 PM Attending Provider: No att. providers found [id number], Tonette Bihari, MD     Marlyne Beards to be D/C'd Home per MD order.  Discussed with the patient the After Visit Summary and all questions fully answered. All IV's discontinued with no bleeding noted. All belongings returned to patient for patient to take home.   Last Vital Signs:  Blood pressure (!) 142/76, pulse (!) 54, temperature 97.7 F (36.5 C), temperature source Oral, resp. rate 16, height 5\' 10"  (1.778 m), weight 90.7 kg, SpO2 100 %.  Discharge Medication List Allergies as of 08/28/2018      Reactions   Elavil [amitriptyline] Anxiety   Sulfa Antibiotics Rash   Childhood allergy      Medication List    STOP taking these medications   aspirin 81 MG tablet     TAKE these medications   ALPRAZolam 0.5 MG tablet Commonly known as:  XANAX Take 0.5 mg 2 (two) times daily as needed by mouth for anxiety (takes 1 dose every morning and another dose only if needed).   AMINO ACID PO Take 1 tablet by mouth daily.   amoxicillin 125 MG chewable tablet Commonly known as:  AMOXIL Chew 125 mg by mouth See admin instructions. Takes 1 capsule 125 mg 1 hour prior to denal procedure   atorvastatin 40 MG tablet Commonly known as:  LIPITOR Take 40 mg by mouth at bedtime.   azelastine 0.1 % nasal spray Commonly known as:  ASTELIN Place 1 spray at bedtime into both nostrils. Use in each nostril as directed   B-complex with vitamin C tablet Take 1 tablet by mouth daily.   Biotin 10000 MCG Tabs Take 10,000 mcg daily by mouth.   Calcium-Magnesium-Zinc-D3 Tabs Take 1 tablet by mouth at bedtime.   Cholecalciferol 2000 units Caps Take 2,000 Units daily by mouth.   CO Q 10 PO Take 200 mg daily by mouth.   cyclobenzaprine 10 MG tablet Commonly known as:  FLEXERIL Take 1 tablet (10 mg total) by mouth 3 (three) times daily as needed for muscle  spasms.   fexofenadine 180 MG tablet Commonly known as:  ALLEGRA Take 180 mg by mouth daily.   FISH OIL-FLAX OIL-BORAGE OIL PO Take 1 capsule 2 (two) times daily by mouth. 800 mg fish oil  800 mg flaxseed oil 400 mg Borage oil   fluticasone 50 MCG/ACT nasal spray Commonly known as:  FLONASE Place 2 sprays daily into both nostrils.   GLUCOSAMINE CHONDR 1500 COMPLX Caps Take 1 capsule by mouth 2 (two) times daily. Cosamine ASU   losartan 50 MG tablet Commonly known as:  COZAAR Take 50 mg by mouth daily.   LUTEIN 20 Caps Take 20 mg daily by mouth.   montelukast 10 MG tablet Commonly known as:  SINGULAIR Take 10 mg by mouth at bedtime.   multivitamin with minerals Tabs tablet Take 0.5 tablets by mouth 2 (two) times daily.   omeprazole 40 MG capsule Commonly known as:  PRILOSEC Take 20 mg by mouth daily as needed (when traveling).   ondansetron 4 MG tablet Commonly known as:  ZOFRAN Take 1 tablet (4 mg total) by mouth every 8 (eight) hours as needed for nausea or vomiting.   oxyCODONE 5 MG immediate release tablet Commonly known as:  Oxy IR/ROXICODONE Take 1 tablet (5 mg total) by mouth every 4 (four) hours as needed for moderate pain (pain). What changed:  reasons  to take this   ranitidine 150 MG tablet Commonly known as:  ZANTAC Take 150 mg 3 (three) times daily as needed by mouth for heartburn (takes 2 times daily and a 3rd dose if needed).   sertraline 50 MG tablet Commonly known as:  ZOLOFT Take 50 mg by mouth at bedtime.

## 2018-08-28 NOTE — Care Management Important Message (Signed)
Important Message  Patient Details  Name: Jesus Blevins MRN: [id number] Date of Birth: 09-Nov-1946   Medicare Important Message Given:  Yes    [name] Montine Circle 08/28/2018, 1:58 PM

## 2018-08-28 NOTE — Progress Notes (Signed)
Patient with discharge order but awaiting on transportation from friend at this time.  JP drain remove. No other distress. Will continue to monitor.    Semaj Coburn. RN

## 2018-08-28 NOTE — Progress Notes (Signed)
Neurosurgery Service Progress Note  Subjective: No acute events overnight. Right leg pain completely resolved after surgery, back pain improved  Objective: Vitals:   08/27/18 1604 08/27/18 1957 08/27/18 2309 08/28/18 0700  BP: 114/63 112/60 (!) 141/67 131/70  Pulse: 60 (!) 56 62 60  Resp: 18 17 20 18   Temp: 98.4 F (36.9 C) 98 F (36.7 C) 98 F (36.7 C) 98.7 F (37.1 C)  TempSrc: Oral Oral  Oral  SpO2: 96% 97% 93% 94%  Weight:      Height:       Temp (24hrs), Avg:98.2 F (36.8 C), Min:98 F (36.7 C), Max:98.7 F (37.1 C)  CBC Latest Ref Rng & Units 08/25/2018 11/03/2017 04/18/2016  WBC 4.0 - 10.5 K/uL 7.1 7.8 10.2  Hemoglobin 13.0 - 17.0 g/dL 13.6 13.7 11.0(L)  Hematocrit 39.0 - 52.0 % 40.6 39.7(L) 30.7(L)  Platelets 150 - 400 K/uL 173 184 135(L)   BMP Latest Ref Rng & Units 08/25/2018 11/03/2017 04/17/2016  Glucose 70 - 99 mg/dL 101(H) 92 119(H)  BUN 8 - 23 mg/dL 15 18 14   Creatinine 0.61 - 1.24 mg/dL 0.77 0.82 0.89  Sodium 135 - 145 mmol/L 140 137 139  Potassium 3.5 - 5.1 mmol/L 3.8 4.0 3.8  Chloride 98 - 111 mmol/L 108 104 106  CO2 22 - 32 mmol/L 23 25 26   Calcium 8.9 - 10.3 mg/dL 9.4 9.6 8.6(L)    Intake/Output Summary (Last 24 hours) at 08/28/2018 0807 Last data filed at 08/28/2018 0500 Gross per 24 hour  Intake 720 ml  Output 155 ml  Net 565 ml    Current Facility-Administered Medications:  .  0.9 %  sodium chloride infusion, 250 mL, Intravenous, Continuous, Tal Neer A, MD .  0.9 %  sodium chloride infusion, , Intravenous, Continuous, Ariyon Mittleman, Joyice Faster, MD, Stopped at 08/27/18 0200 .  acetaminophen (TYLENOL) tablet 650 mg, 650 mg, Oral, Q4H PRN, 650 mg at 08/26/18 1336 **OR** acetaminophen (TYLENOL) suppository 650 mg, 650 mg, Rectal, Q4H PRN, Judith Part, MD .  ALPRAZolam Duanne Moron) tablet 0.5 mg, 0.5 mg, Oral, BID PRN, Judith Part, MD .  atorvastatin (LIPITOR) tablet 40 mg, 40 mg, Oral, QHS, Gennette Shadix, Joyice Faster, MD, 40 mg at 08/27/18  2152 .  azelastine (ASTELIN) 0.1 % nasal spray 1 spray, 1 spray, Each Nare, QHS, Judith Part, MD, 1 spray at 08/27/18 2153 .  cyclobenzaprine (FLEXERIL) tablet 10 mg, 10 mg, Oral, TID PRN, Judith Part, MD, 10 mg at 08/27/18 1523 .  fluticasone (FLONASE) 50 MCG/ACT nasal spray 2 spray, 2 spray, Each Nare, Daily, Judith Part, MD, 2 spray at 08/27/18 0844 .  heparin injection 5,000 Units, 5,000 Units, Subcutaneous, Q8H, Judith Part, MD, 5,000 Units at 08/28/18 0549 .  HYDROmorphone (DILAUDID) injection 0.5 mg, 0.5 mg, Intravenous, Q2H PRN, Judith Part, MD, 0.5 mg at 08/27/18 0203 .  lactated ringers infusion, , Intravenous, Continuous, Nolon Nations, MD .  loratadine (CLARITIN) tablet 10 mg, 10 mg, Oral, Daily, Laurel Harnden, Joyice Faster, MD, 10 mg at 08/27/18 0845 .  losartan (COZAAR) tablet 50 mg, 50 mg, Oral, Daily, Judith Part, MD, 50 mg at 08/27/18 0845 .  menthol-cetylpyridinium (CEPACOL) lozenge 3 mg, 1 lozenge, Oral, PRN **OR** phenol (CHLORASEPTIC) mouth spray 1 spray, 1 spray, Mouth/Throat, PRN, Peggie Hornak A, MD .  montelukast (SINGULAIR) tablet 10 mg, 10 mg, Oral, QHS, Salbador Fiveash, Joyice Faster, MD, 10 mg at 08/27/18 2152 .  ondansetron (ZOFRAN) tablet 4 mg, 4 mg, Oral,  Q6H PRN **OR** ondansetron (ZOFRAN) injection 4 mg, 4 mg, Intravenous, Q6H PRN, Omauri Boeve A, MD .  oxyCODONE (Oxy IR/ROXICODONE) immediate release tablet 10 mg, 10 mg, Oral, Q3H PRN, Judith Part, MD, 10 mg at 08/28/18 0421 .  oxyCODONE (Oxy IR/ROXICODONE) immediate release tablet 5 mg, 5 mg, Oral, Q3H PRN, Judith Part, MD, 5 mg at 08/26/18 1601 .  pantoprazole (PROTONIX) EC tablet 40 mg, 40 mg, Oral, Daily, Judith Part, MD, 40 mg at 08/28/18 0732 .  sertraline (ZOLOFT) tablet 50 mg, 50 mg, Oral, QHS, Elizbeth Posa, Joyice Faster, MD, 50 mg at 08/27/18 2152 .  sodium chloride flush (NS) 0.9 % injection 3 mL, 3 mL, Intravenous, Q12H, Ravneet Spilker, Joyice Faster, MD, 3 mL  at 08/26/18 0837 .  sodium chloride flush (NS) 0.9 % injection 3 mL, 3 mL, Intravenous, PRN, Judith Part, MD   Physical Exam: AOx3, PERRL, EOMI, FS, TM, Strength 5/5 x4, SILTx4 Incision c/d/i  Assessment & Plan: 72 y.o. man s/p right L4-5 TLIF for L4/5 nerve root decompression and bilateral posterior pedicle instrumentation, recovering well. -drain output 35cc overnight, d/c drain -discharge home today -PT/OT no needs -continue ambulation -cont SQH until discharged, cont SCDs/TEDs  Judith Part  08/28/18 8:07 AM

## 2019-05-10 IMAGING — RF DG C-ARM 61-120 MIN
1 series · 2 of 2 positions shown · non-contrast
Comparison: Lumbar MRI 07/30/2018.

CLINICAL DATA: Lumbar fusion.

EXAM:
DG C-ARM 61-120 MIN; LUMBAR SPINE - 2-3 VIEW

[Series 1: run · 2 of 2 slices shown]
[im 1/2]
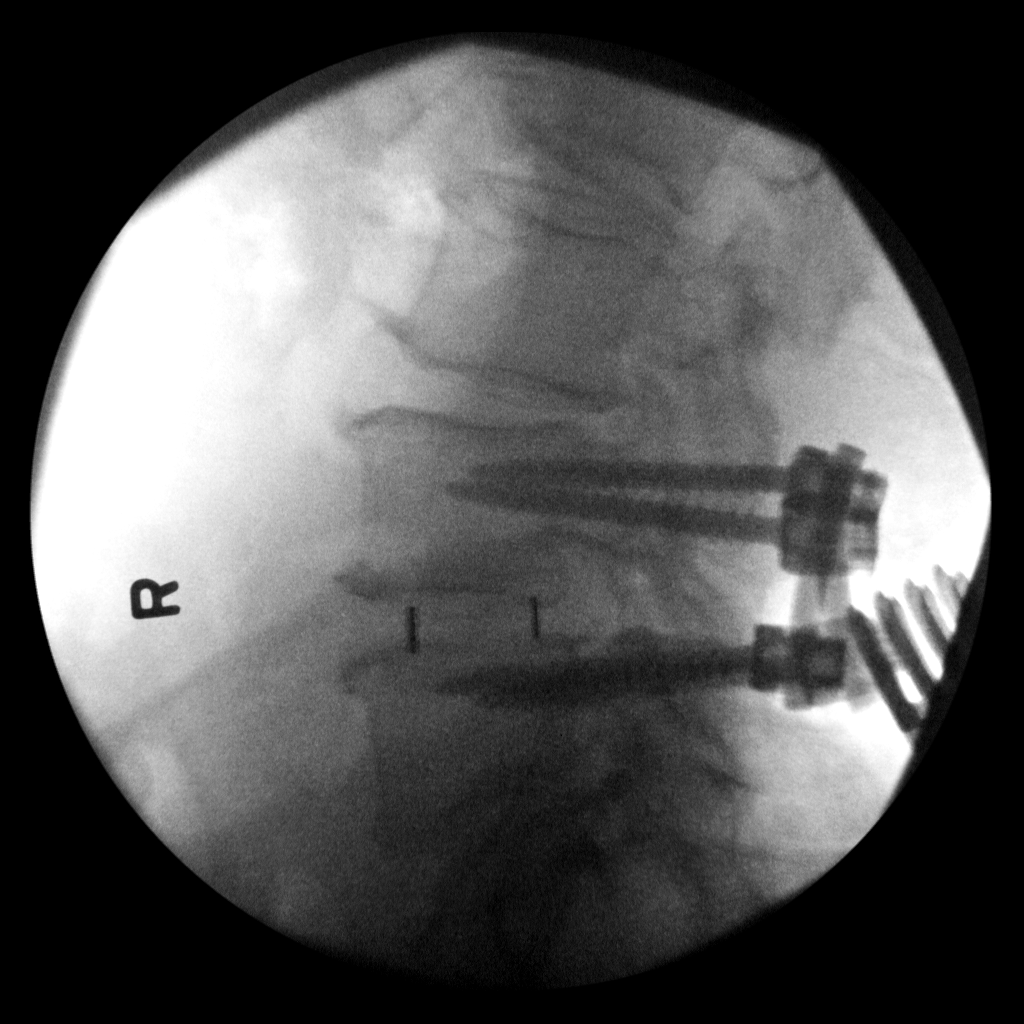
[im 2/2]
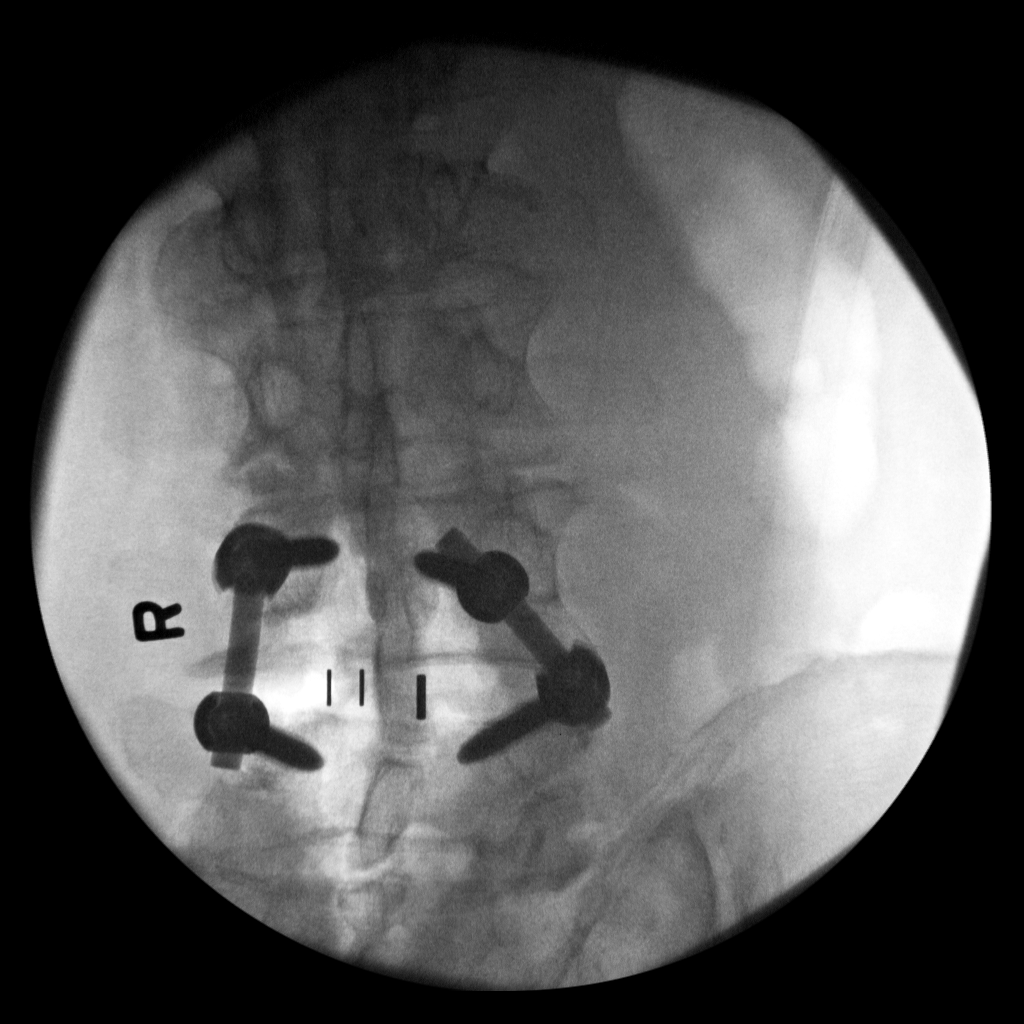

[2 of 2 positions shown; findings below may reference images not displayed]

FINDINGS: Lumbar spine numbered with the lowest appearing lumbar shaped
vertebra on AP view as L5. L4-L5 posterior and interbody fusion.
Hardware intact. Anatomic alignment. Fluoroscopy time 1 minutes 13
seconds. Two images obtained.
IMPRESSION: Postsurgical changes lumbar spine as above.

## 2019-08-24 ENCOUNTER — Other Ambulatory Visit: Payer: Self-pay | Admitting: Neurological Surgery

## 2019-08-24 ENCOUNTER — Other Ambulatory Visit (HOSPITAL_COMMUNITY): Payer: Self-pay | Admitting: Neurological Surgery

## 2019-08-24 DIAGNOSIS — M5416 Radiculopathy, lumbar region: Secondary | ICD-10-CM

## 2019-09-04 ENCOUNTER — Other Ambulatory Visit: Payer: Self-pay

## 2019-09-04 ENCOUNTER — Ambulatory Visit
Admission: RE | Admit: 2019-09-04 | Discharge: 2019-09-04 | Disposition: A | Discharge status: Home / Self Care | Payer: Medicare Other | Source: Ambulatory Visit | Attending: Neurological Surgery | Admitting: Neurological Surgery

## 2019-09-04 DIAGNOSIS — M5416 Radiculopathy, lumbar region: Secondary | ICD-10-CM | POA: Diagnosis present

## 2021-02-19 ENCOUNTER — Other Ambulatory Visit: Payer: Self-pay

## 2021-02-19 ENCOUNTER — Emergency Department
Admission: EM | Admit: 2021-02-19 | Discharge: 2021-02-19 | Disposition: A | Discharge status: Home / Self Care | Payer: Medicare PPO | Attending: Emergency Medicine | Admitting: Emergency Medicine

## 2021-02-19 ENCOUNTER — Emergency Department: Payer: Medicare PPO

## 2021-02-19 DIAGNOSIS — S61111A Laceration without foreign body of right thumb with damage to nail, initial encounter: Secondary | ICD-10-CM | POA: Diagnosis not present

## 2021-02-19 DIAGNOSIS — Z96652 Presence of left artificial knee joint: Secondary | ICD-10-CM | POA: Insufficient documentation

## 2021-02-19 DIAGNOSIS — Z87891 Personal history of nicotine dependence: Secondary | ICD-10-CM | POA: Insufficient documentation

## 2021-02-19 DIAGNOSIS — I2510 Atherosclerotic heart disease of native coronary artery without angina pectoris: Secondary | ICD-10-CM | POA: Insufficient documentation

## 2021-02-19 DIAGNOSIS — I10 Essential (primary) hypertension: Secondary | ICD-10-CM | POA: Diagnosis not present

## 2021-02-19 DIAGNOSIS — W312XXA Contact with powered woodworking and forming machines, initial encounter: Secondary | ICD-10-CM | POA: Diagnosis not present

## 2021-02-19 DIAGNOSIS — Z79899 Other long term (current) drug therapy: Secondary | ICD-10-CM | POA: Insufficient documentation

## 2021-02-19 DIAGNOSIS — S62521A Displaced fracture of distal phalanx of right thumb, initial encounter for closed fracture: Secondary | ICD-10-CM | POA: Insufficient documentation

## 2021-02-19 DIAGNOSIS — S6991XA Unspecified injury of right wrist, hand and finger(s), initial encounter: Secondary | ICD-10-CM | POA: Diagnosis present

## 2021-02-19 MED ORDER — ONDANSETRON 4 MG PO TBDP: 4.0000 mg | ORAL_TABLET | Freq: Three times a day (TID) | ORAL | 0 refills | Status: AC | PRN

## 2021-02-19 MED ORDER — CEPHALEXIN 500 MG PO CAPS: 500.0000 mg | ORAL_CAPSULE | Freq: Three times a day (TID) | ORAL | 0 refills | Status: AC

## 2021-02-19 MED ORDER — ONDANSETRON 4 MG PO TBDP
4.0000 mg | ORAL_TABLET | Freq: Once | ORAL | Status: AC
  Administered 2021-02-19: 4 mg via ORAL
  Filled 2021-02-19: qty 1

## 2021-02-19 MED ORDER — OXYCODONE-ACETAMINOPHEN 5-325 MG PO TABS: 1.0000 | ORAL_TABLET | Freq: Four times a day (QID) | ORAL | 0 refills | Status: DC | PRN

## 2021-02-19 MED ORDER — OXYCODONE-ACETAMINOPHEN 5-325 MG PO TABS
1.0000 | ORAL_TABLET | Freq: Once | ORAL | Status: AC
  Administered 2021-02-19: 1 via ORAL
  Filled 2021-02-19: qty 1

## 2021-02-19 NOTE — Discharge Instructions (Addendum)
Take Keflex 3 times a day for the next 7 days. Please call Dr. Theodore Demark office tomorrow morning.  He plans on seeing you on Wednesday.

## 2021-02-19 NOTE — ED Triage Notes (Signed)
Pt has laceration to right thumb from table saw accident tonight. Bleeding controlled.

## 2021-02-19 NOTE — ED Provider Notes (Signed)
[facility]-EMERGENCY DEPARTMENT  ____________________________________________  Time seen: Approximately 8:24 PM  I have reviewed the triage vital signs and the nursing notes.   HISTORY  Chief Complaint Laceration   Historian Patient     HPI Jesus Blevins is a 75 y.o. male presents to the emergency department with a macerated severely lacerated distal right thumb laceration.  Patient sustained laceration using a table saw.  He states that pain has been well controlled up until this point and bleeding resolved while in route to the emergency department.  He states his last tetanus shot was 2 years ago.  Injury occurred right before coming to the emergency department. Patient is right hand dominant.    Past Medical History:  Diagnosis Date  . Allergic rhinitis   . Anemia   . Anxiety   . Coronary artery disease   . Depression   . Diverticulosis   . GERD (gastroesophageal reflux disease)   . History of kidney stones   . Hypercalcemia   . Hypertension   . IBS (irritable colon syndrome)   . Osteoarthritis   . Restless leg   . Sinusitis      Immunizations up to date:  Yes.     Past Medical History:  Diagnosis Date  . Allergic rhinitis   . Anemia   . Anxiety   . Coronary artery disease   . Depression   . Diverticulosis   . GERD (gastroesophageal reflux disease)   . History of kidney stones   . Hypercalcemia   . Hypertension   . IBS (irritable colon syndrome)   . Osteoarthritis   . Restless leg   . Sinusitis     Patient Active Problem List   Diagnosis Date Noted  . Lumbar radiculopathy 08/25/2018  . Status post total left knee replacement 04/16/2016    Past Surgical History:  Procedure Laterality Date  . BACK SURGERY    . COLONOSCOPY    . COLONOSCOPY N/A 04/21/2015   Procedure: COLONOSCOPY;  Surgeon: Manya Silvas, MD;  Location: [facility] ENDOSCOPY;  Service: Endoscopy;  Laterality: N/A;  . COLONOSCOPY WITH PROPOFOL N/A 08/05/2018   Procedure: COLONOSCOPY  WITH PROPOFOL;  Surgeon: Manya Silvas, MD;  Location: [facility] ENDOSCOPY;  Service: Endoscopy;  Laterality: N/A;  . JOINT REPLACEMENT Left 2017  . knee arthoscopy Right   . Lumb disectomy cervicle/lumbar multiple levels    . sinus septum and polyp surgery    . spine surgery L3-L5    . Thumb trigger release Left   . tongue polyp excision    . TOTAL KNEE ARTHROPLASTY Left 04/16/2016   Procedure: TOTAL KNEE ARTHROPLASTY;  Surgeon: Thornton Park, MD;  Location: [facility] ORS;  Service: Orthopedics;  Laterality: Left;  . TRANSFORAMINAL LUMBAR INTERBODY FUSION (TLIF) WITH PEDICLE SCREW FIXATION 1 LEVEL Right 08/25/2018   Procedure: Right Lumbar 4-5 Transforaminal lumbar interbody fusion with Lumbar 4 Bilateral Foraminotomies;  Surgeon: Judith Part, MD;  Location: [place];  Service: Neurosurgery;  Laterality: Right;  Right Lumbar 4-5 Transforaminal lumbar interbody fusion with Lumbar 4 Bilateral Foraminotomies  . TRIGGER FINGER RELEASE Left 11/06/2017   Procedure: RELEASE TRIGGER FINGER;  Surgeon: Thornton Park, MD;  Location: [facility] ORS;  Service: Orthopedics;  Laterality: Left;    Prior to Admission medications   Medication Sig Start Date End Date Taking? Authorizing Provider  cephALEXin (KEFLEX) 500 MG capsule Take 1 capsule (500 mg total) by mouth 3 (three) times daily for 7 days. 02/19/21 02/26/21 Yes Vallarie Mare M, PA-C  ondansetron (  ZOFRAN ODT) 4 MG disintegrating tablet Take 1 tablet (4 mg total) by mouth every 8 (eight) hours as needed for up to 5 days. 02/19/21 02/24/21 Yes Vallarie Mare M, PA-C  oxyCODONE-acetaminophen (PERCOCET/ROXICET) 5-325 MG tablet Take 1 tablet by mouth every 6 (six) hours as needed for up to 5 days. 02/19/21 02/24/21 Yes Vallarie Mare M, PA-C  ALPRAZolam Duanne Moron) 0.5 MG tablet Take 0.5 mg 2 (two) times daily as needed by mouth for anxiety (takes 1 dose every morning and another dose only if needed).     [provider]  Amino Acids (AMINO ACID PO) Take 1 tablet  by mouth daily.    [provider]  amoxicillin (AMOXIL) 125 MG chewable tablet Chew 125 mg by mouth See admin instructions. Takes 1 capsule 125 mg 1 hour prior to denal procedure     [provider]  atorvastatin (LIPITOR) 40 MG tablet Take 40 mg by mouth at bedtime.     [provider]  azelastine (ASTELIN) 0.1 % nasal spray Place 1 spray at bedtime into both nostrils. Use in each nostril as directed     [provider]  B Complex-C (B-COMPLEX WITH VITAMIN C) tablet Take 1 tablet by mouth daily.    [provider]  Biotin 10000 MCG TABS Take 10,000 mcg daily by mouth.    [provider]  Cholecalciferol 2000 units CAPS Take 2,000 Units daily by mouth.    [provider]  Coenzyme Q10 (CO Q 10 PO) Take 200 mg daily by mouth.    [provider]  cyclobenzaprine (FLEXERIL) 10 MG tablet Take 1 tablet (10 mg total) by mouth 3 (three) times daily as needed for muscle spasms. 08/27/18   Judith Part, MD  fexofenadine (ALLEGRA) 180 MG tablet Take 180 mg by mouth daily.    [provider]  Flax Oil-Fish Oil-Borage Oil (FISH OIL-FLAX OIL-BORAGE OIL PO) Take 1 capsule 2 (two) times daily by mouth. 800 mg fish oil  800 mg flaxseed oil 400 mg Borage oil    [provider]  fluticasone (FLONASE) 50 MCG/ACT nasal spray Place 2 sprays daily into both nostrils.     [provider]  Glucosamine-Chondroit-Vit C-Mn (GLUCOSAMINE CHONDR 1500 COMPLX) CAPS Take 1 capsule by mouth 2 (two) times daily. Cosamine ASU    [provider]  losartan (COZAAR) 50 MG tablet Take 50 mg by mouth daily.    [provider]  Misc Natural Products (LUTEIN 20) CAPS Take 20 mg daily by mouth.    [provider]  montelukast (SINGULAIR) 10 MG tablet Take 10 mg by mouth at bedtime.    [provider]  Multiple Minerals-Vitamins (CALCIUM-MAGNESIUM-ZINC-D3) TABS Take 1 tablet by mouth at bedtime.      [provider]  Multiple Vitamin (MULTIVITAMIN WITH MINERALS) TABS tablet Take 0.5 tablets by mouth 2 (two) times daily.    [provider]  omeprazole (PRILOSEC) 40 MG capsule Take 20 mg by mouth daily as needed (when traveling).     [provider]  ondansetron (ZOFRAN) 4 MG tablet Take 1 tablet (4 mg total) by mouth every 8 (eight) hours as needed for nausea or vomiting. 11/06/17   Thornton Park, MD  ranitidine (ZANTAC) 150 MG tablet Take 150 mg 3 (three) times daily as needed by mouth for heartburn (takes 2 times daily and a 3rd dose if needed).    [provider]  sertraline (ZOLOFT) 50 MG tablet Take 50 mg by  mouth at bedtime.     [provider]    Allergies Elavil [amitriptyline] and Sulfa antibiotics  No family history on file.  Social History Social History   Tobacco Use  . Smoking status: Never Smoker  . Smokeless tobacco: Former Systems developer    Types: Secondary school teacher  . Vaping Use: Never used  Substance Use Topics  . Alcohol use: Yes    Comment: occasionally  . Drug use: No    Comment: CBD oil daily     Review of Systems  Constitutional: No fever/chills Eyes:  No discharge ENT: No upper respiratory complaints. Respiratory: no cough. No SOB/ use of accessory muscles to breath Gastrointestinal:   No nausea, no vomiting.  No diarrhea.  No constipation. Musculoskeletal: Patient has right hand pain.  Skin: Negative for rash, abrasions, lacerations, ecchymosis.    ____________________________________________   PHYSICAL EXAM:  VITAL SIGNS: ED Triage Vitals  Enc Vitals Group     BP 02/19/21 1925 (!) 168/75     Pulse Rate 02/19/21 1925 (!) 52     Resp 02/19/21 1925 16     Temp 02/19/21 1925 98.2 F (36.8 C)     Temp src --      SpO2 02/19/21 1925 96 %     Weight 02/19/21 1923 205 lb (93 kg)     Height 02/19/21 1923 5\' 10"  (1.778 m)     Head Circumference --      Peak Flow --      Pain Score 02/19/21 1923 6      Pain Loc --      Pain Edu? --      Excl. in [place]? --      Constitutional: Alert and oriented. Well appearing and in no acute distress. Eyes: Conjunctivae are normal. PERRL. EOMI. Head: Atraumatic. Cardiovascular: Normal rate, regular rhythm. Normal S1 and S2.  Good peripheral circulation. Respiratory: Normal respiratory effort without tachypnea or retractions. Lungs CTAB. Good air entry to the bases with no decreased or absent breath sounds Gastrointestinal: Bowel sounds x 4 quadrants. Soft and nontender to palpation. No guarding or rigidity. No distention. Musculoskeletal: Patient can actively move right thumb. Neurologic:  Normal for age. No gross focal neurologic deficits are appreciated.  Skin: Laceration of right thumb is severely macerated with severe soft tissue swelling along the volar aspect of the right thumb.  Palpable radial pulse, right. Psychiatric: Mood and affect are normal for age. Speech and behavior are normal.   ____________________________________________   LABS (all labs ordered are listed, but only abnormal results are displayed)  Labs Reviewed - No data to display ____________________________________________  EKG   ____________________________________________  RADIOLOGY Unk Pinto, personally viewed and evaluated these images (plain radiographs) as part of my medical decision making, as well as reviewing the written report by the radiologist.  DG Finger Thumb Right  Result Date: 02/19/2021 CLINICAL DATA:  Right thumb laceration.  Table saw accident tonight. EXAM: RIGHT THUMB 2+V COMPARISON:  None. FINDINGS: Comminuted fracture of the thumb distal phalanx with innumerable fracture fragments. Fracture likely extends to the ulnar aspect of the interphalangeal joint. Overlying skin irregularity with soft tissue air. No convincing radiopaque foreign body. Proximal phalanx and metacarpal are intact. IMPRESSION: Comminuted and likely intra-articular fracture  of the thumb distal phalanx with overlying soft tissue injury, most consistent with open fracture. No convincing radiopaque foreign body. Electronically Signed   By: Keith Rake M.D.   On: 02/19/2021 20:02    ____________________________________________  PROCEDURES  Procedure(s) performed:     Procedures     Medications  oxyCODONE-acetaminophen (PERCOCET/ROXICET) 5-325 MG per tablet 1 tablet (1 tablet Oral Given 02/19/21 2002)  ondansetron (ZOFRAN-ODT) disintegrating tablet 4 mg (4 mg Oral Given 02/19/21 2003)     ____________________________________________   INITIAL IMPRESSION / ASSESSMENT AND PLAN / ED COURSE  Pertinent labs & imaging results that were available during my care of the patient were reviewed by me and considered in my medical decision making (see chart for details).      Assessment and plan Right thumb laceration 75 year old male presents to the emergency department with a macerated right thumb laceration.  Patient was hypertensive at triage and bradycardic at triage.  Laceration appears severely macerated.  ----------------------------------------- 8:36 PM on 02/19/2021 -----------------------------------------  I reached out to orthopedist on-call, Dr. Rudene Christians.  Dr. Rudene Christians personally reviewed pictures of patient's injury.  He recommended soaking wound and applying Xeroform gauze with thumb immobilized in spica splint.  He told patient to contact his office tomorrow with anticipation of seeing patient in the office on Wednesday morning.  Dr. Rudene Christians tentatively suggested surgery for Thursday or Friday.  Patient's right thumb indicated a comminuted distal phalanx fracture.  Patient's wound was irrigated and splinted per Dr. Theodore Demark recommendation.  Patient was discharged with Keflex.  Patient's daughter was briefed regarding patient care instructions.  Patient was discharged with Percocet.  Return precautions were given to return with new or worsening  symptoms.   ____________________________________________  FINAL CLINICAL IMPRESSION(S) / ED DIAGNOSES  Final diagnoses:  Laceration of right thumb without foreign body with damage to nail, initial encounter      NEW MEDICATIONS STARTED DURING THIS VISIT:  ED Discharge Orders         Ordered    cephALEXin (KEFLEX) 500 MG capsule  3 times daily        02/19/21 2144    oxyCODONE-acetaminophen (PERCOCET/ROXICET) 5-325 MG tablet  Every 6 hours PRN        02/19/21 2144    ondansetron (ZOFRAN ODT) 4 MG disintegrating tablet  Every 8 hours PRN        02/19/21 2144              This chart was dictated using voice recognition software/Dragon. Despite best efforts to proofread, errors can occur which can change the meaning. Any change was purely unintentional.     Lannie Fields, PA-C 02/19/21 2232    Arta Silence, MD 02/19/21 2252

## 2021-02-19 NOTE — ED Notes (Signed)
Pt with partial amputation of right thumb distal to DIP joint. At time of assessment, bleeding seemed to be controlled. Pt states he injured himself on a table saw at approx 1900.

## 2021-02-21 ENCOUNTER — Other Ambulatory Visit: Payer: Self-pay | Admitting: Orthopedic Surgery

## 2021-02-21 ENCOUNTER — Other Ambulatory Visit
Admission: RE | Admit: 2021-02-21 | Discharge: 2021-02-21 | Disposition: A | Discharge status: Home / Self Care | Payer: Medicare PPO | Source: Ambulatory Visit | Attending: Orthopedic Surgery | Admitting: Orthopedic Surgery

## 2021-02-21 ENCOUNTER — Other Ambulatory Visit: Payer: Self-pay

## 2021-02-21 DIAGNOSIS — Z01812 Encounter for preprocedural laboratory examination: Secondary | ICD-10-CM | POA: Diagnosis present

## 2021-02-21 DIAGNOSIS — Z20822 Contact with and (suspected) exposure to covid-19: Secondary | ICD-10-CM | POA: Insufficient documentation

## 2021-02-21 MED ORDER — CEFAZOLIN SODIUM-DEXTROSE 2-4 GM/100ML-% IV SOLN
2.0000 g | INTRAVENOUS | Status: AC
  Administered 2021-02-22: 2 g via INTRAVENOUS

## 2021-02-21 NOTE — Anesthesia Preprocedure Evaluation (Addendum)
Anesthesia Evaluation  Patient identified by MRN, date of birth, ID band Patient awake    Reviewed: Allergy & Precautions, NPO status , Patient's Chart, lab work & pertinent test results  History of Anesthesia Complications Negative for: history of anesthetic complications  Airway Mallampati: II       Dental   Pulmonary neg sleep apnea, neg COPD, Not current smoker,           Cardiovascular hypertension, Pt. on medications (-) Past MI and (-) CHF (-) dysrhythmias (-) Valvular Problems/Murmurs     Neuro/Psych neg Seizures Anxiety Depression    GI/Hepatic Neg liver ROS, GERD  Medicated and Controlled,  Endo/Other  neg diabetes  Renal/GU negative Renal ROS     Musculoskeletal   Abdominal   Peds  Hematology   Anesthesia Other Findings   Reproductive/Obstetrics                            Anesthesia Physical Anesthesia Plan  ASA: II  Anesthesia Plan: General   Post-op Pain Management:    Induction: Intravenous  PONV Risk Score and Plan: 2  Airway Management Planned: LMA  Additional Equipment:   Intra-op Plan:   Post-operative Plan:   Informed Consent: I have reviewed the patients History and Physical, chart, labs and discussed the procedure including the risks, benefits and alternatives for the proposed anesthesia with the patient or authorized representative who has indicated his/her understanding and acceptance.       Plan Discussed with:   Anesthesia Plan Comments:         Anesthesia Quick Evaluation

## 2021-02-22 ENCOUNTER — Other Ambulatory Visit: Payer: Self-pay

## 2021-02-22 ENCOUNTER — Encounter
Admission: RE | Disposition: A | Discharge status: Home / Self Care | Payer: Self-pay | Source: Home / Self Care | Attending: Orthopedic Surgery

## 2021-02-22 ENCOUNTER — Ambulatory Visit: Payer: Medicare PPO | Admitting: Anesthesiology

## 2021-02-22 ENCOUNTER — Ambulatory Visit: Payer: Medicare PPO

## 2021-02-22 ENCOUNTER — Ambulatory Visit
Admission: RE | Admit: 2021-02-22 | Discharge: 2021-02-22 | Disposition: A | Discharge status: Home / Self Care | Payer: Medicare PPO | Attending: Orthopedic Surgery | Admitting: Orthopedic Surgery

## 2021-02-22 ENCOUNTER — Encounter: Payer: Self-pay | Admitting: Orthopedic Surgery

## 2021-02-22 DIAGNOSIS — Z96652 Presence of left artificial knee joint: Secondary | ICD-10-CM | POA: Insufficient documentation

## 2021-02-22 DIAGNOSIS — W312XXA Contact with powered woodworking and forming machines, initial encounter: Secondary | ICD-10-CM | POA: Insufficient documentation

## 2021-02-22 DIAGNOSIS — Z7982 Long term (current) use of aspirin: Secondary | ICD-10-CM | POA: Insufficient documentation

## 2021-02-22 DIAGNOSIS — Z882 Allergy status to sulfonamides status: Secondary | ICD-10-CM | POA: Insufficient documentation

## 2021-02-22 DIAGNOSIS — I2510 Atherosclerotic heart disease of native coronary artery without angina pectoris: Secondary | ICD-10-CM | POA: Insufficient documentation

## 2021-02-22 DIAGNOSIS — E785 Hyperlipidemia, unspecified: Secondary | ICD-10-CM | POA: Insufficient documentation

## 2021-02-22 DIAGNOSIS — Z79899 Other long term (current) drug therapy: Secondary | ICD-10-CM | POA: Insufficient documentation

## 2021-02-22 DIAGNOSIS — Z888 Allergy status to other drugs, medicaments and biological substances status: Secondary | ICD-10-CM | POA: Diagnosis not present

## 2021-02-22 DIAGNOSIS — S62522A Displaced fracture of distal phalanx of left thumb, initial encounter for closed fracture: Secondary | ICD-10-CM | POA: Insufficient documentation

## 2021-02-22 DIAGNOSIS — I10 Essential (primary) hypertension: Secondary | ICD-10-CM | POA: Diagnosis not present

## 2021-02-22 HISTORY — PX: I & D EXTREMITY: SHX5045

## 2021-02-22 HISTORY — PX: OPEN REDUCTION INTERNAL FIXATION (ORIF) METACARPAL: SHX6234

## 2021-02-22 LAB — SARS CORONAVIRUS 2 (TAT 6-24 HRS): SARS Coronavirus 2: NEGATIVE

## 2021-02-22 SURGERY — OPEN REDUCTION INTERNAL FIXATION (ORIF) METACARPAL
Anesthesia: General | Site: Finger | Laterality: Right

## 2021-02-22 MED ORDER — ORAL CARE MOUTH RINSE: 15.0000 mL | Freq: Once | OROMUCOSAL | Status: AC

## 2021-02-22 MED ORDER — NEOMYCIN-POLYMYXIN B GU 40-200000 IR SOLN
Status: AC
  Filled 2021-02-22: qty 2

## 2021-02-22 MED ORDER — BUPIVACAINE HCL 0.5 % IJ SOLN
INTRAMUSCULAR | Status: DC | PRN
  Administered 2021-02-22: 20 mL

## 2021-02-22 MED ORDER — PROPOFOL 10 MG/ML IV BOLUS
INTRAVENOUS | Status: DC | PRN
  Administered 2021-02-22: 100 mg via INTRAVENOUS
  Administered 2021-02-22: 60 mg via INTRAVENOUS

## 2021-02-22 MED ORDER — ONDANSETRON HCL 4 MG/2ML IJ SOLN: 4.0000 mg | Freq: Four times a day (QID) | INTRAMUSCULAR | Status: DC | PRN

## 2021-02-22 MED ORDER — METOCLOPRAMIDE HCL 5 MG/ML IJ SOLN: 5.0000 mg | Freq: Three times a day (TID) | INTRAMUSCULAR | Status: DC | PRN

## 2021-02-22 MED ORDER — OXYCODONE-ACETAMINOPHEN 5-325 MG PO TABS: 1.0000 | ORAL_TABLET | Freq: Four times a day (QID) | ORAL | 0 refills | Status: AC | PRN

## 2021-02-22 MED ORDER — EPHEDRINE SULFATE 50 MG/ML IJ SOLN
INTRAMUSCULAR | Status: DC | PRN
  Administered 2021-02-22: 10 mg via INTRAVENOUS

## 2021-02-22 MED ORDER — CHLORHEXIDINE GLUCONATE 0.12 % MT SOLN
15.0000 mL | Freq: Once | OROMUCOSAL | Status: AC
  Administered 2021-02-22: 15 mL via OROMUCOSAL

## 2021-02-22 MED ORDER — GLYCOPYRROLATE 0.2 MG/ML IJ SOLN
INTRAMUSCULAR | Status: DC | PRN
  Administered 2021-02-22: .2 mg via INTRAVENOUS

## 2021-02-22 MED ORDER — EPHEDRINE 5 MG/ML INJ
INTRAVENOUS | Status: AC
  Filled 2021-02-22: qty 10

## 2021-02-22 MED ORDER — FENTANYL CITRATE (PF) 100 MCG/2ML IJ SOLN: 25.0000 ug | INTRAMUSCULAR | Status: DC | PRN

## 2021-02-22 MED ORDER — BUPIVACAINE HCL (PF) 0.5 % IJ SOLN
INTRAMUSCULAR | Status: AC
  Filled 2021-02-22: qty 30

## 2021-02-22 MED ORDER — ONDANSETRON HCL 4 MG/2ML IJ SOLN: 4.0000 mg | Freq: Once | INTRAMUSCULAR | Status: DC | PRN

## 2021-02-22 MED ORDER — LACTATED RINGERS IV SOLN: INTRAVENOUS | Status: DC

## 2021-02-22 MED ORDER — LIDOCAINE HCL (CARDIAC) PF 100 MG/5ML IV SOSY
PREFILLED_SYRINGE | INTRAVENOUS | Status: DC | PRN
  Administered 2021-02-22: 80 mg via INTRAVENOUS

## 2021-02-22 MED ORDER — NEOMYCIN-POLYMYXIN B GU 40-200000 IR SOLN
Status: DC | PRN
  Administered 2021-02-22: 2 mL

## 2021-02-22 MED ORDER — ONDANSETRON HCL 4 MG PO TABS: 4.0000 mg | ORAL_TABLET | Freq: Four times a day (QID) | ORAL | Status: DC | PRN

## 2021-02-22 MED ORDER — ONDANSETRON HCL 4 MG/2ML IJ SOLN
INTRAMUSCULAR | Status: DC | PRN
  Administered 2021-02-22: 4 mg via INTRAVENOUS

## 2021-02-22 MED ORDER — SODIUM CHLORIDE 0.9 % IV SOLN: INTRAVENOUS | Status: DC

## 2021-02-22 MED ORDER — METOCLOPRAMIDE HCL 10 MG PO TABS: 5.0000 mg | ORAL_TABLET | Freq: Three times a day (TID) | ORAL | Status: DC | PRN

## 2021-02-22 SURGICAL SUPPLY — 45 items
APL PRP STRL LF DISP 70% ISPRP (MISCELLANEOUS) ×2
APL PRP STRL LF ISPRP CHG 10.5 (MISCELLANEOUS)
APPLICATOR CHLORAPREP 10.5 ORG (MISCELLANEOUS) IMPLANT
BNDG CONFORM 2 STRL LF (GAUZE/BANDAGES/DRESSINGS) ×3 IMPLANT
BNDG GAUZE 1X2.1 STRL (MISCELLANEOUS) ×3 IMPLANT
CHLORAPREP W/TINT 26 (MISCELLANEOUS) ×3 IMPLANT
COVER WAND RF STERILE (DRAPES) ×3 IMPLANT
CUFF TOURN SGL QUICK 18X4 (TOURNIQUET CUFF) ×3 IMPLANT
CUFF TOURN SGL QUICK 24 (TOURNIQUET CUFF)
CUFF TOURN SGL QUICK 30 (TOURNIQUET CUFF)
CUFF TRNQT CYL 24X4X16.5-23 (TOURNIQUET CUFF) IMPLANT
CUFF TRNQT CYL 30X4X21-28X (TOURNIQUET CUFF) IMPLANT
DRAPE FLUOR MINI C-ARM 54X84 (DRAPES) ×3 IMPLANT
ELECT CAUTERY BLADE 6.4 (BLADE) ×3 IMPLANT
ELECT REM PT RETURN 9FT ADLT (ELECTROSURGICAL) ×3
ELECTRODE REM PT RTRN 9FT ADLT (ELECTROSURGICAL) ×2 IMPLANT
GAUZE SPONGE 4X4 12PLY STRL (GAUZE/BANDAGES/DRESSINGS) ×3 IMPLANT
GAUZE XEROFORM 1X8 LF (GAUZE/BANDAGES/DRESSINGS) ×3 IMPLANT
GLOVE SURG SYN 9.0  PF PI (GLOVE) ×1
GLOVE SURG SYN 9.0 PF PI (GLOVE) ×2 IMPLANT
GOWN SRG 2XL LVL 4 RGLN SLV (GOWNS) ×2 IMPLANT
GOWN STRL NON-REIN 2XL LVL4 (GOWNS) ×3
GOWN STRL REUS W/ TWL LRG LVL3 (GOWN DISPOSABLE) ×2 IMPLANT
GOWN STRL REUS W/TWL LRG LVL3 (GOWN DISPOSABLE) ×3
K-WIRE .045X6 (WIRE) IMPLANT
K-WIRE FX150X1.25XNS LF SS (Wire) ×2 IMPLANT
K-WIRE SMOOTH (Wire) ×3 IMPLANT
KIT TURNOVER KIT A (KITS) ×3 IMPLANT
KWIRE FX150X1.25XNS LF SS (Wire) ×2 IMPLANT
MANIFOLD NEPTUNE II (INSTRUMENTS) ×3 IMPLANT
NEEDLE FILTER BLUNT 18X 1/2SAF (NEEDLE) ×1
NEEDLE FILTER BLUNT 18X1 1/2 (NEEDLE) ×2 IMPLANT
NS IRRIG 500ML POUR BTL (IV SOLUTION) ×3 IMPLANT
PACK EXTREMITY ARMC (MISCELLANEOUS) ×3 IMPLANT
PIN BALLS 3/8 F/.045 WIRE (MISCELLANEOUS) ×3 IMPLANT
SCALPEL PROTECTED #15 DISP (BLADE) ×6 IMPLANT
STOCKINETTE BIAS CUT 3 980034 (MISCELLANEOUS) ×3 IMPLANT
STRAP SAFETY 5IN WIDE (MISCELLANEOUS) IMPLANT
SUT ETHIBOND 4-0 (SUTURE) ×3 IMPLANT
SUT ETHILON 4-0 (SUTURE) ×3
SUT ETHILON 4-0 FS2 18XMFL BLK (SUTURE) ×2
SUT ETHILON 5 0 CL P 3 (SUTURE) ×3 IMPLANT
SUT VIC AB 4-0 P-3 18XBRD (SUTURE) ×2 IMPLANT
SUT VIC AB 4-0 P3 18 (SUTURE) ×3
SUTURE ETHLN 4-0 FS2 18XMF BLK (SUTURE) ×2 IMPLANT

## 2021-02-22 NOTE — Anesthesia Procedure Notes (Signed)
Procedure Name: LMA Insertion Date/Time: 02/22/2021 5:43 PM Performed by: Jerrye Noble, CRNA Pre-anesthesia Checklist: Patient identified, Emergency Drugs available, Suction available and Patient being monitored Patient Re-evaluated:Patient Re-evaluated prior to induction Oxygen Delivery Method: Circle system utilized Preoxygenation: Pre-oxygenation with 100% oxygen Induction Type: IV induction Ventilation: Mask ventilation without difficulty LMA: LMA inserted LMA Size: 4.5 Number of attempts: 1 Placement Confirmation: positive ETCO2 and breath sounds checked- equal and bilateral Tube secured with: Tape Dental Injury: Teeth and Oropharynx as per pre-operative assessment

## 2021-02-22 NOTE — Anesthesia Postprocedure Evaluation (Signed)
Anesthesia Post Note  Patient: Jesus Blevins  Procedure(s) Performed: Irrigation & Debridement, ORIF right thumb fracture (Right Finger) IRRIGATION AND DEBRIDEMENT EXTREMITY (Right )  Patient location during evaluation: PACU Anesthesia Type: General Level of consciousness: awake and alert Pain management: pain level controlled Vital Signs Assessment: post-procedure vital signs reviewed and stable Respiratory status: spontaneous breathing and respiratory function stable Cardiovascular status: stable Anesthetic complications: no   No complications documented.   Last Vitals:  Vitals:   02/22/21 1915 02/22/21 1920  BP: (!) 158/78 (!) 158/78  Pulse: (!) 48 (!) 46  Resp: 11 18  Temp: (!) 36.2 C (!) 36.2 C  SpO2: 97% 98%    Last Pain:  Vitals:   02/22/21 1920  TempSrc: Temporal  PainSc: 0-No pain                 Isaic Syler K

## 2021-02-22 NOTE — H&P (Signed)
Chief Complaint  Patient presents with  . Laceration  Right thumb laceration    History of the Present Illness: Jesus Blevins is a 75 y.o. male here today.   The patient presents for follow-up from the emergency room after a table saw injury with a very complex laceration to the right thumb and multiple fractures to the distal phalanx.  The patient states he is right-hand dominant. He is otherwise healthy.  The patient is retired. 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 He enjoys fishing, woodworking, gardening, reading, swimming and walking.  I have reviewed past medical, surgical, social and family history, and allergies as documented in the EMR.  Past Medical History: Past Medical History:  Diagnosis Date  . Allergic rhinitis  with h/o bronchospasm. FOLLOWED BY DR. Jeanne Ivan, [facility] PULMONOLOGY METHACHOLINE CHALLENGE NEGATIVE 2010  . Anemia  B12, SPIEP PREVIOUSLY NORMAL.  Marland Kitchen Anxiety  . C. difficile colitis  . CAD (coronary artery disease)  CARDIAC CATH 9/09 WITH 50% CIRC LESION W/O SIG. DISEASE ELSEWHERE. FOLLOWED BY DR. Ubaldo Glassing.  . Congenital colonic AV malformation  . Depression  . GERD (gastroesophageal reflux disease)  . Hypercalcemia  . Hyperlipemia  . Hypertension  . Osteoarthritis  LUMBAR SPINE, HANDS, TRIGGER NODULES, CARPAL TUNNEL  . Recurrent major depressive disorder, in partial remission (CMS-HCC) 01/16/2016  . Restless leg  . Sinusitis, unspecified   Past Surgical History: Past Surgical History:  Procedure Laterality Date  . COLONOSCOPY 10/04/1996  Normal Colon  . COLONOSCOPY 04/05/2005  Int Hemorrhoids: CBF 03/2015  . COLONOSCOPY 04/21/2015  Adenomatous Polyps: CBF 04/2018; OV made 05/13/2018 @ 9am w/Kim Jerelene Redden NP (dh)  . COLONOSCOPY 08/05/2018  Adenomatous Polyp: CBF 07/2023  . KNEE ARTHROSCOPY Right 1989  . LT. THUMB TRIGGER RELEASE 2012  Dr. Rudene Christians  . LUMB DISCECTOMY CERVICLE/LUMBAR MULTIPLE LEVELS Right 05/16/2014  Procedure:  LUMB DISCECTOMY CERVICAL / LUMBAR MULTIPLE LEVELS; Surgeon: Lilli Few, MD; Location: [place]; Service: Neurosurgery; Laterality: Right;  . REPLACEMENT TOTAL KNEE Left 02/2016  . SINUS SEPTUM AND POLYP SURGERY 1989  sinus surgery x 2  . SPINE SURGERY Right 05/16/2014  L3-4 L4-5 MLD  . Tongue Polyp Excision 1988   Past Family History: Family History  Problem Relation Age of Onset  . Diabetes Mother  . High blood pressure (Hypertension) Mother  . Glaucoma Mother  . Cancer Father  pancreatic  . Diabetes Other  . Rheum arthritis Other  . Macular degeneration Neg Hx   Medications: Current Outpatient Medications Ordered in Epic  Medication Sig Dispense Refill  . ALPRAZolam (XANAX) 0.5 MG tablet TAKE (1) TABLET TWICE A DAY. 60 tablet 5  . aspirin 81 MG EC tablet Take 81 mg by mouth every other day.   Marland Kitchen atorvastatin (LIPITOR) 40 MG tablet Take 1 tablet (40 mg total) by mouth once daily. 90 tablet 0  . azelastine (ASTELIN) 137 mcg nasal spray Place 1 spray into both nostrils 2 (two) times daily 30 mL 5  . calcium carbonate-vitamin D3 (OS-CAL 500+D) 500 mg(1,250mg ) -400 unit tablet Take 1 tablet by mouth once daily.  Marland Kitchen EPINEPHrine (EPIPEN) 0.3 mg/0.3 mL pen injector Inject 0.3 mg into the muscle as needed  . fexofenadine (ALLEGRA) 180 MG tablet Take 180 mg by mouth once daily.  . fluticasone (FLONASE) 50 mcg/actuation nasal spray Place 2 sprays into both nostrils once daily. 16 g 11  . GLUCOSAM SUL NA/CHONDR SU A NA (GLUCOSAMINE & CHONDROIT SUL.NA ORAL) Take 1 tablet by mouth  once daily. Or every other day  . Herbal Supplement Cannabis oil once daily  . losartan (COZAAR) 50 MG tablet Take 1 tablet (50 mg total) by mouth once daily. 90 tablet 0  . montelukast (SINGULAIR) 10 mg tablet Take 10 mg by mouth once daily.  . multivitamin tablet Take 1 tablet by mouth once daily.  Marland Kitchen omega-3 fatty acids/fish oil 340-1,000 mg capsule Take 2 capsules by mouth once daily.  Marland Kitchen omeprazole  (PRILOSEC) 20 MG DR capsule Take 1 capsule (20 mg total) by mouth once daily. 90 capsule 0  . sertraline (ZOLOFT) 50 MG tablet Take 1 tablet (50 mg total) by mouth nightly. 90 tablet 1  . sildenafil, antihypertensive, (REVATIO) 20 mg tablet 2-5 tabs PO daily PRN 20 tablet 11  . naproxen sodium (ALEVE, ANAPROX) 220 MG tablet Take 220 mg by mouth as needed (Patient not taking: Reported on 02/21/2021 )   No current Epic-ordered facility-administered medications on file.   Allergies: Allergies  Allergen Reactions  . Elavil [Amitriptyline] Other (See Comments)  AGITATION  . Sulfa (Sulfonamide Antibiotics) Rash    Body mass index is 30.48 kg/m.  Review of Systems: A comprehensive 14 point ROS was performed, reviewed, and the pertinent orthopaedic findings are documented in the HPI.  Vitals:  02/21/21 1258  BP: (!) 144/82    General Physical Examination:   General/Constitutional: No apparent distress: well-nourished and well developed. Eyes: Pupils equal, round with synchronous movement. Lungs: Clear to auscultation HEENT: Normal Vascular: No edema, swelling or tenderness, except as noted in detailed exam. Cardiac: Heart rate and rhythm is regular. Integumentary: No impressive skin lesions present, except as noted in detailed exam. Neuro/Psych: Normal mood and affect, oriented to person, place and time.  Musculoskeletal Examination:  On exam, a complex laceration with essentially open fracture of the right thumb. The thumbnail is intact.  Radiographs:  No new imaging studies were obtained today.  Assessment: ICD-10-CM  1. Open displaced fracture of distal phalanx of left thumb, initial encounter S62.522B   Plan:  The patient has clinical findings of right thumb complex laceration and open fracture.  We discussed the patient's treatment options. I explained he has a soft tissue injury. I recommend irrigation and debridement, laceration repair, and pinning of the distal  phalanx across the IP joint to maintain alignment.  We will schedule the patient for surgery in the near future.  Surgical Risks:  The nature of the condition and the proposed procedure has been reviewed in detail with the patient. Surgical versus non-surgical options and prognosis for recovery have been reviewed and the inherent risks and benefits of each have been discussed including the risks of infection, bleeding, injury to nerves/blood vessels/tendons, incomplete relief of symptoms, persisting pain and/or stiffness, loss of function, complex regional pain syndrome, failure of the procedure, as appropriate.  Attestation: I, Dawn Royse, am documenting for [employer], MD utilizing [place].     Electronically signed by Lauris Poag, MD at 02/21/2021 6:35 PM EDT  Back to top of Progress Notes  Lin Landsman, CMA - 02/21/2021 1:00 PM EDT Formatting of this note might be different from the original. Review of Systems  Musculoskeletal: Positive for joint pain.  All other systems reviewed and are negative.    Electronically signed by Lin Landsman, CMA at 02/21/2021 6:35 PM EDT    Reviewed  H+P. No changes noted.

## 2021-02-22 NOTE — Transfer of Care (Signed)
Immediate Anesthesia Transfer of Care Note  Patient: Jesus Blevins  Procedure(s) Performed: Irrigation & Debridement, ORIF right thumb fracture (Right Finger) IRRIGATION AND DEBRIDEMENT EXTREMITY (Right )  Patient Location: PACU  Anesthesia Type:General  Level of Consciousness: drowsy  Airway & Oxygen Therapy: Patient connected to face mask oxygen  Post-op Assessment: Post -op Vital signs reviewed and stable  Post vital signs: stable  Last Vitals:  Vitals Value Taken Time  BP 158/78 02/22/21 1920  Temp 36.2 C 02/22/21 1920  Pulse 51 02/22/21 1923  Resp 9 02/22/21 1924  SpO2 98 % 02/22/21 1923  Vitals shown include unvalidated device data.  Last Pain:  Vitals:   02/22/21 1920  TempSrc: Temporal  PainSc: 0-No pain         Complications: No complications documented.

## 2021-02-22 NOTE — Discharge Instructions (Addendum)
AMBULATORY SURGERY  DISCHARGE INSTRUCTIONS   1) The drugs that you were given will stay in your system until tomorrow so for the next 24 hours you should not:  A) Drive an automobile B) Make any legal decisions C) Drink any alcoholic beverage   2) You may resume regular meals tomorrow.  Today it is better to start with liquids and gradually work up to solid foods.  You may eat anything you prefer, but it is better to start with liquids, then soup and crackers, and gradually work up to solid foods.   3) Please notify your doctor immediately if you have any unusual bleeding, trouble breathing, redness and pain at the surgery site, drainage, fever, or pain not relieved by medication.    4) Additional Instructions:        Please contact your physician with any problems or Same Day Surgery at [phone], Monday through Friday 6 am to 4 pm, or [place] at [facility] number at [phone].Keep dressing clean and dry. Keep arm elevated is much as you can through the weekend. Pain medicine as directed. Call office if having problems.

## 2021-02-22 NOTE — Op Note (Signed)
02/22/2021  6:46 PM  PATIENT:  Jesus Blevins  75 y.o. male  PRE-OPERATIVE DIAGNOSIS:  Open displaced fracture of distal phalanx of left thumb, initial encounter S62.522B  POST-OPERATIVE DIAGNOSIS:  Open displaced fracture of distal phalanx of left thumb, initial encounter S62.522B  PROCEDURE:  Procedure(s): Irrigation & Debridement, ORIF right thumb fracture (Right) IRRIGATION AND DEBRIDEMENT EXTREMITY (Right)  SURGEON: Laurene Footman, MD  ASSISTANTS: None  ANESTHESIA:   general  EBL:  Total I/O In: 600 [I.V.:500; IV Piggyback:100] Out: 10 [Blood:10]  BLOOD ADMINISTERED:none  DRAINS: none   LOCAL MEDICATIONS USED:  MARCAINE     SPECIMEN:  No Specimen  DISPOSITION OF SPECIMEN:  N/A  COUNTS:  YES  TOURNIQUET: Tourniquet applied but not utilized  IMPLANTS: 0.45 K wire x1  DICTATION: .Dragon Dictation patient was brought the operating room and after adequate anesthesia was obtained the right arm was prepped and draped you sterile fashion with a tourniquet by the upper arm but not needed during the surgery.  After patient identification and timeout procedures were completed the wound was first irrigated and there were was noted to be some foreign material within the wound consistent with wood.  After thorough irrigation the skin edges were debrided with a scalpel as well as subcutaneous tissue which appeared devitalized.  Following this a K wire was sent out through the tip of the thumb through the distal phalanx fracture tuft then was brought retrograde back across the IP joint with the finger compressed to get bone opposition is much as possible as there was some bone defect secondary to the saw injury.  At this point 4-0 Vicryl was used to reapproximate subcutaneously loosely followed by simple interrupted 5-0 nylon there was a laceration at the base of the nail on either side along with double cut to the pulp this these were sewn is close to anatomic as possible although there  was some soft tissue loss with the debridement.  The dressing applied then of Xeroform 4 x 4's Kling and a bias wrap.  A Jurgens ball was placed over the tip of the K wire after cutting the wire.  PLAN OF CARE: Discharge to home after PACU  PATIENT DISPOSITION:  PACU - hemodynamically stable.

## 2021-02-23 ENCOUNTER — Encounter: Payer: Self-pay | Admitting: Orthopedic Surgery

## 2021-11-04 IMAGING — DX DG FINGER THUMB 2+V*R*
3 series · 3 of 3 positions shown · non-contrast
Comparison: None.

CLINICAL DATA: Right thumb laceration.  Table saw accident tonight.

EXAM:
RIGHT THUMB 2+V

[finger ap]
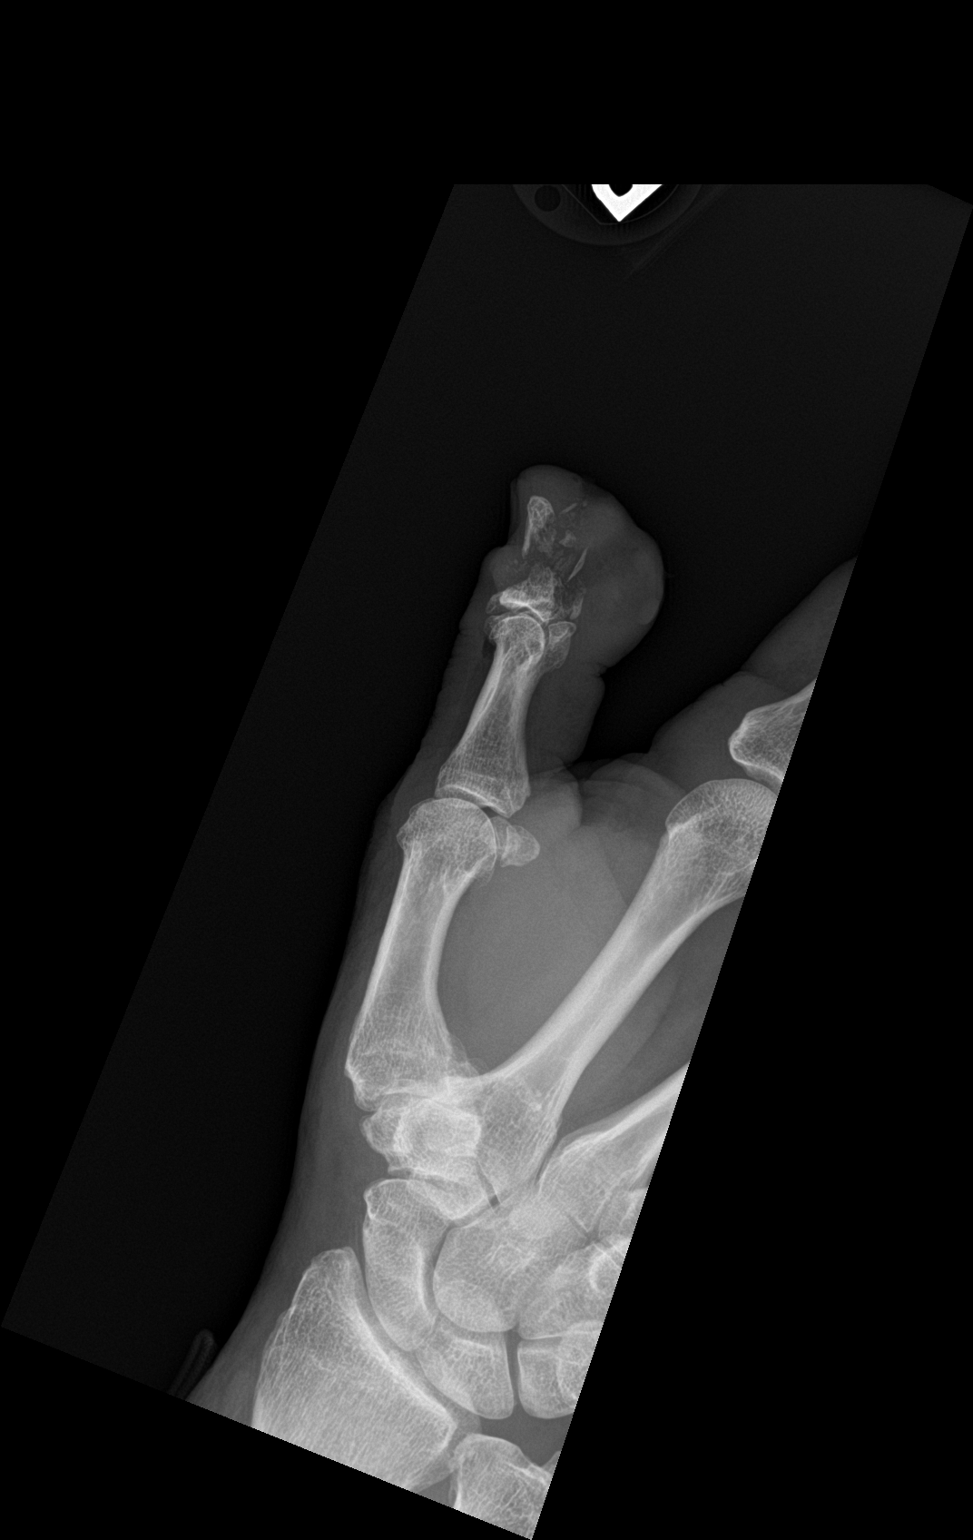

[finger obl]
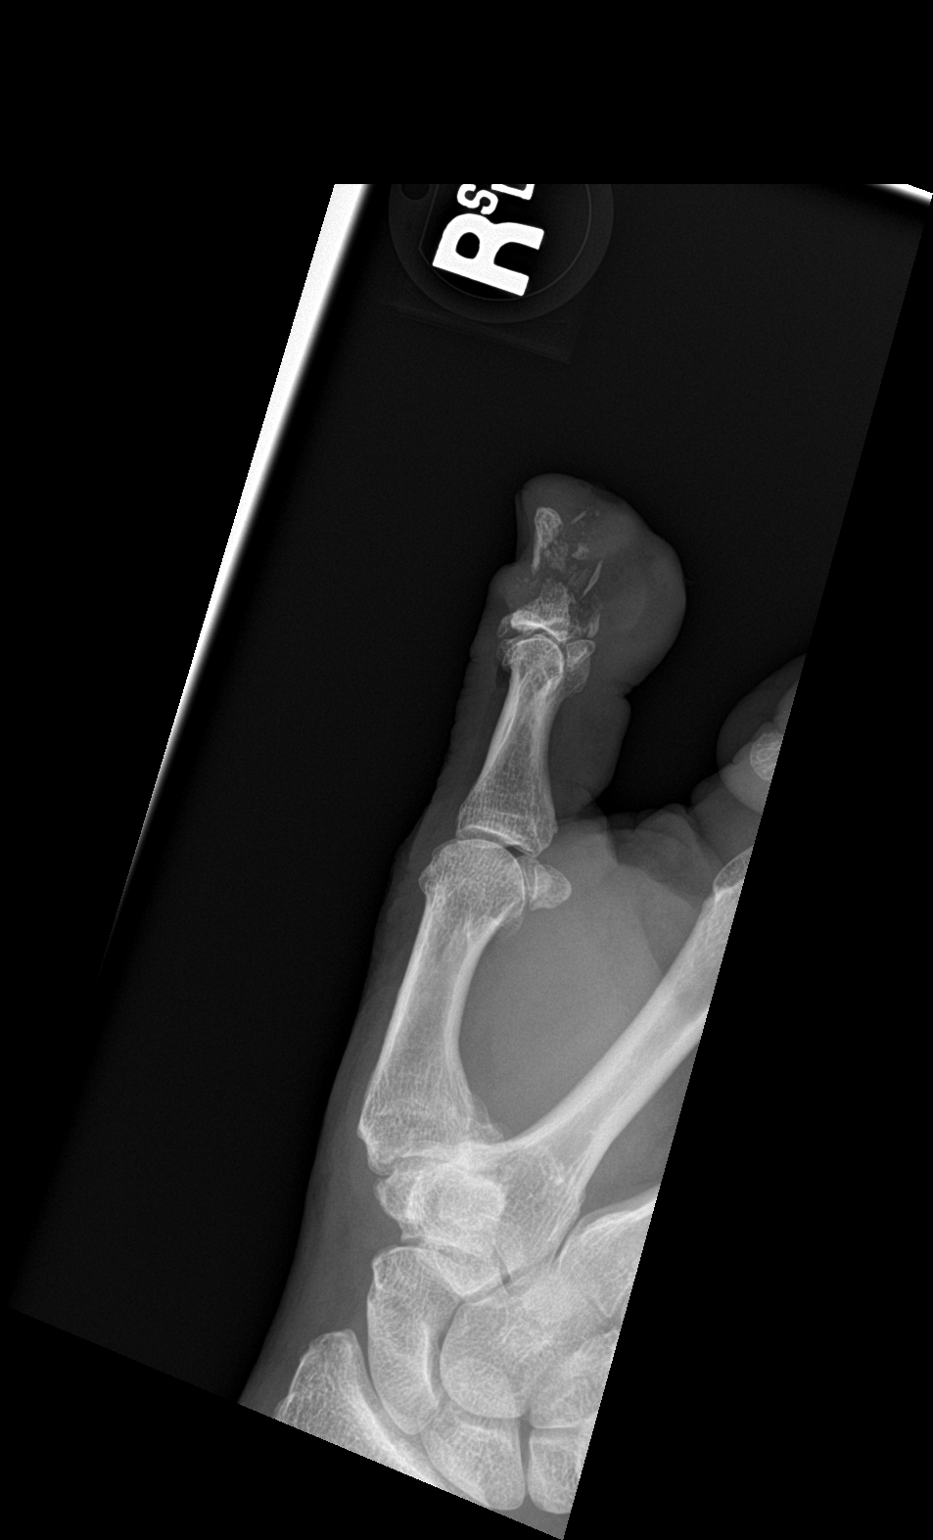

[finger lat]
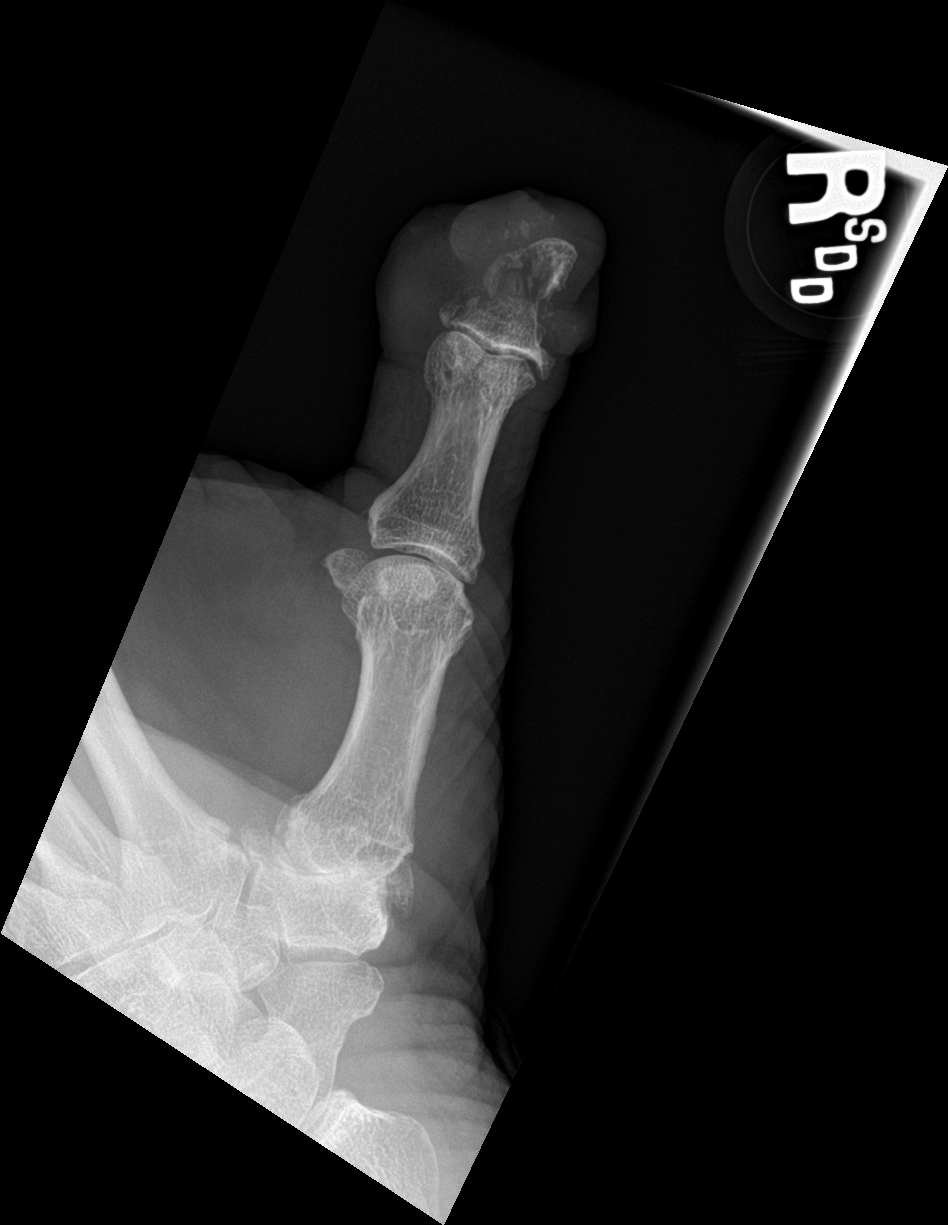

[3 of 3 positions shown; findings below may reference images not displayed]

FINDINGS: Comminuted fracture of the thumb distal phalanx with innumerable
fracture fragments. Fracture likely extends to the ulnar aspect of
the interphalangeal joint. Overlying skin irregularity with soft
tissue air. No convincing radiopaque foreign body. Proximal phalanx
and metacarpal are intact.
IMPRESSION: Comminuted and likely intra-articular fracture of the thumb distal
phalanx with overlying soft tissue injury, most consistent with open
fracture. No convincing radiopaque foreign body.

## 2023-04-16 ENCOUNTER — Other Ambulatory Visit: Payer: Self-pay | Admitting: Unknown Physician Specialty

## 2023-04-16 DIAGNOSIS — J0191 Acute recurrent sinusitis, unspecified: Secondary | ICD-10-CM

## 2023-04-23 ENCOUNTER — Ambulatory Visit
Admission: RE | Admit: 2023-04-23 | Discharge: 2023-04-23 | Disposition: A | Discharge status: Home / Self Care | Payer: Medicare PPO | Source: Ambulatory Visit | Attending: Unknown Physician Specialty | Admitting: Unknown Physician Specialty

## 2023-04-23 DIAGNOSIS — J0191 Acute recurrent sinusitis, unspecified: Secondary | ICD-10-CM

## 2023-06-05 ENCOUNTER — Other Ambulatory Visit: Payer: Self-pay

## 2023-06-05 ENCOUNTER — Ambulatory Visit
Admission: RE | Admit: 2023-06-05 | Discharge: 2023-06-05 | Disposition: A | Discharge status: Home / Self Care | Payer: Medicare PPO | Attending: Cardiology | Admitting: Cardiology

## 2023-06-05 ENCOUNTER — Encounter: Payer: Self-pay | Admitting: Cardiology

## 2023-06-05 ENCOUNTER — Encounter
Admission: RE | Disposition: A | Discharge status: Home / Self Care | Payer: Self-pay | Source: Home / Self Care | Attending: Cardiology

## 2023-06-05 DIAGNOSIS — R001 Bradycardia, unspecified: Secondary | ICD-10-CM | POA: Diagnosis not present

## 2023-06-05 DIAGNOSIS — R9430 Abnormal result of cardiovascular function study, unspecified: Secondary | ICD-10-CM | POA: Diagnosis present

## 2023-06-05 DIAGNOSIS — I2510 Atherosclerotic heart disease of native coronary artery without angina pectoris: Secondary | ICD-10-CM | POA: Insufficient documentation

## 2023-06-05 HISTORY — PX: LEFT HEART CATH AND CORONARY ANGIOGRAPHY: CATH118249

## 2023-06-05 SURGERY — LEFT HEART CATH AND CORONARY ANGIOGRAPHY
Anesthesia: Moderate Sedation | Laterality: Left

## 2023-06-05 MED ORDER — HEPARIN (PORCINE) IN NACL 1000-0.9 UT/500ML-% IV SOLN
INTRAVENOUS | Status: DC | PRN
  Administered 2023-06-05: 1000 mL

## 2023-06-05 MED ORDER — MIDAZOLAM HCL 2 MG/2ML IJ SOLN
INTRAMUSCULAR | Status: AC
  Filled 2023-06-05: qty 2

## 2023-06-05 MED ORDER — HEPARIN (PORCINE) IN NACL 1000-0.9 UT/500ML-% IV SOLN
INTRAVENOUS | Status: AC
  Filled 2023-06-05: qty 1000

## 2023-06-05 MED ORDER — IOHEXOL 300 MG/ML  SOLN
INTRAMUSCULAR | Status: DC | PRN
  Administered 2023-06-05: 92 mL

## 2023-06-05 MED ORDER — SODIUM CHLORIDE 0.9% FLUSH: 3.0000 mL | Freq: Two times a day (BID) | INTRAVENOUS | Status: DC

## 2023-06-05 MED ORDER — SODIUM CHLORIDE 0.9 % IV SOLN: 250.0000 mL | INTRAVENOUS | Status: DC | PRN

## 2023-06-05 MED ORDER — VERAPAMIL HCL 2.5 MG/ML IV SOLN
INTRAVENOUS | Status: AC
  Filled 2023-06-05: qty 2

## 2023-06-05 MED ORDER — SODIUM CHLORIDE 0.9 % WEIGHT BASED INFUSION: 1.0000 mL/kg/h | INTRAVENOUS | Status: DC

## 2023-06-05 MED ORDER — ASPIRIN 81 MG PO CHEW
CHEWABLE_TABLET | ORAL | Status: AC
  Filled 2023-06-05: qty 1

## 2023-06-05 MED ORDER — VERAPAMIL HCL 2.5 MG/ML IV SOLN
INTRAVENOUS | Status: DC | PRN
  Administered 2023-06-05: 2.5 mg via INTRA_ARTERIAL

## 2023-06-05 MED ORDER — HEPARIN SODIUM (PORCINE) 1000 UNIT/ML IJ SOLN
INTRAMUSCULAR | Status: DC | PRN
  Administered 2023-06-05: 4000 [IU] via INTRAVENOUS

## 2023-06-05 MED ORDER — MIDAZOLAM HCL 2 MG/2ML IJ SOLN
INTRAMUSCULAR | Status: DC | PRN
  Administered 2023-06-05: .5 mg via INTRAVENOUS

## 2023-06-05 MED ORDER — SODIUM CHLORIDE 0.9% FLUSH: 3.0000 mL | INTRAVENOUS | Status: DC | PRN

## 2023-06-05 MED ORDER — ASPIRIN 81 MG PO CHEW
81.0000 mg | CHEWABLE_TABLET | ORAL | Status: AC
  Administered 2023-06-05: 81 mg via ORAL

## 2023-06-05 MED ORDER — HEPARIN SODIUM (PORCINE) 1000 UNIT/ML IJ SOLN
INTRAMUSCULAR | Status: AC
  Filled 2023-06-05: qty 10

## 2023-06-05 MED ORDER — ACETAMINOPHEN 325 MG PO TABS: 650.0000 mg | ORAL_TABLET | ORAL | Status: DC | PRN

## 2023-06-05 MED ORDER — FENTANYL CITRATE (PF) 100 MCG/2ML IJ SOLN
INTRAMUSCULAR | Status: DC | PRN
  Administered 2023-06-05: 25 ug via INTRAVENOUS

## 2023-06-05 MED ORDER — FENTANYL CITRATE (PF) 100 MCG/2ML IJ SOLN
INTRAMUSCULAR | Status: AC
  Filled 2023-06-05: qty 2

## 2023-06-05 MED ORDER — SODIUM CHLORIDE 0.9 % WEIGHT BASED INFUSION
3.0000 mL/kg/h | INTRAVENOUS | Status: AC
  Administered 2023-06-05: 3 mL/kg/h via INTRAVENOUS

## 2023-06-05 MED ORDER — LIDOCAINE HCL (PF) 1 % IJ SOLN
INTRAMUSCULAR | Status: DC | PRN
  Administered 2023-06-05: 2 mL

## 2023-06-05 MED ORDER — HYDRALAZINE HCL 20 MG/ML IJ SOLN: 10.0000 mg | INTRAMUSCULAR | Status: DC | PRN

## 2023-06-05 MED ORDER — ONDANSETRON HCL 4 MG/2ML IJ SOLN: 4.0000 mg | Freq: Four times a day (QID) | INTRAMUSCULAR | Status: DC | PRN

## 2023-06-05 MED ORDER — LABETALOL HCL 5 MG/ML IV SOLN: 10.0000 mg | INTRAVENOUS | Status: DC | PRN

## 2023-06-05 SURGICAL SUPPLY — 10 items
CATH 5FR JL3.5 JR4 ANG PIG MP (CATHETERS) IMPLANT
DEVICE RAD TR BAND REGULAR (VASCULAR PRODUCTS) IMPLANT
DRAPE BRACHIAL (DRAPES) IMPLANT
GLIDESHEATH SLEND SS 6F .021 (SHEATH) IMPLANT
GUIDEWIRE INQWIRE 1.5J.035X260 (WIRE) IMPLANT
INQWIRE 1.5J .035X260CM (WIRE) ×1
PACK CARDIAC CATH (CUSTOM PROCEDURE TRAY) ×1 IMPLANT
PROTECTION STATION PRESSURIZED (MISCELLANEOUS) ×1
SET ATX-X65L (MISCELLANEOUS) IMPLANT
STATION PROTECTION PRESSURIZED (MISCELLANEOUS) IMPLANT

## 2023-06-05 NOTE — Progress Notes (Signed)
Patient remains clinically stable post heart cath. Family with patient. Denies complaints at this time, no bleeding nor hematoma at right radial site. Ready for discharge. Has received discharge instructions earlier per [facility] RN/care nurse.

## 2023-06-06 ENCOUNTER — Encounter: Payer: Self-pay | Admitting: Cardiology

## 2023-08-06 ENCOUNTER — Other Ambulatory Visit: Payer: Self-pay | Admitting: Neurological Surgery

## 2023-08-06 DIAGNOSIS — M5116 Intervertebral disc disorders with radiculopathy, lumbar region: Secondary | ICD-10-CM

## 2023-08-12 ENCOUNTER — Ambulatory Visit
Admission: RE | Admit: 2023-08-12 | Discharge: 2023-08-12 | Disposition: A | Discharge status: Home / Self Care | Payer: Medicare PPO | Source: Ambulatory Visit | Attending: Neurological Surgery | Admitting: Neurological Surgery

## 2023-08-12 DIAGNOSIS — M5116 Intervertebral disc disorders with radiculopathy, lumbar region: Secondary | ICD-10-CM | POA: Insufficient documentation

## 2023-09-02 DIAGNOSIS — I495 Sick sinus syndrome: Secondary | ICD-10-CM

## 2023-09-09 ENCOUNTER — Other Ambulatory Visit: Payer: Self-pay

## 2023-09-09 ENCOUNTER — Ambulatory Visit
Admission: RE | Admit: 2023-09-09 | Discharge: 2023-09-09 | Disposition: A | Discharge status: Home / Self Care | Payer: Medicare PPO | Attending: Cardiology | Admitting: Cardiology

## 2023-09-09 ENCOUNTER — Encounter: Payer: Self-pay | Admitting: Cardiology

## 2023-09-09 ENCOUNTER — Encounter
Admission: RE | Disposition: A | Discharge status: Home / Self Care | Payer: Self-pay | Source: Home / Self Care | Attending: Cardiology

## 2023-09-09 DIAGNOSIS — Z87891 Personal history of nicotine dependence: Secondary | ICD-10-CM | POA: Insufficient documentation

## 2023-09-09 DIAGNOSIS — I2510 Atherosclerotic heart disease of native coronary artery without angina pectoris: Secondary | ICD-10-CM | POA: Insufficient documentation

## 2023-09-09 DIAGNOSIS — I495 Sick sinus syndrome: Secondary | ICD-10-CM | POA: Insufficient documentation

## 2023-09-09 HISTORY — PX: PACEMAKER IMPLANT: EP1218

## 2023-09-09 SURGERY — PACEMAKER IMPLANT
Anesthesia: Moderate Sedation

## 2023-09-09 MED ORDER — CALCIUM CARBONATE ANTACID 500 MG PO CHEW: 1.0000 | CHEWABLE_TABLET | Freq: Every day | ORAL | Status: DC

## 2023-09-09 MED ORDER — LIDOCAINE HCL 1 % IJ SOLN
INTRAMUSCULAR | Status: AC
  Filled 2023-09-09: qty 40

## 2023-09-09 MED ORDER — AZELASTINE HCL 0.1 % NA SOLN: 1.0000 | Freq: Two times a day (BID) | NASAL | Status: DC

## 2023-09-09 MED ORDER — CEPHALEXIN 500 MG PO CAPS: 500.0000 mg | ORAL_CAPSULE | Freq: Two times a day (BID) | ORAL | Status: DC

## 2023-09-09 MED ORDER — SERTRALINE HCL 50 MG PO TABS: 50.0000 mg | ORAL_TABLET | Freq: Every day | ORAL | Status: DC

## 2023-09-09 MED ORDER — MIDAZOLAM HCL 2 MG/2ML IJ SOLN
INTRAMUSCULAR | Status: AC
  Filled 2023-09-09: qty 2

## 2023-09-09 MED ORDER — ONDANSETRON HCL 4 MG/2ML IJ SOLN: 4.0000 mg | Freq: Four times a day (QID) | INTRAMUSCULAR | Status: DC | PRN

## 2023-09-09 MED ORDER — LOSARTAN POTASSIUM 50 MG PO TABS: 100.0000 mg | ORAL_TABLET | Freq: Every day | ORAL | Status: DC

## 2023-09-09 MED ORDER — ATORVASTATIN CALCIUM 20 MG PO TABS
40.0000 mg | ORAL_TABLET | Freq: Every day | ORAL | Status: DC
Start: 2023-09-09 — End: 2023-09-09

## 2023-09-09 MED ORDER — CEFAZOLIN SODIUM-DEXTROSE 2-4 GM/100ML-% IV SOLN
2.0000 g | INTRAVENOUS | Status: AC
  Administered 2023-09-09: 2 g via INTRAVENOUS

## 2023-09-09 MED ORDER — CHLORHEXIDINE GLUCONATE CLOTH 2 % EX PADS
6.0000 | MEDICATED_PAD | Freq: Every day | CUTANEOUS | Status: DC
  Administered 2023-09-09: 6 via TOPICAL

## 2023-09-09 MED ORDER — MIDAZOLAM HCL 2 MG/2ML IJ SOLN
INTRAMUSCULAR | Status: DC | PRN
  Administered 2023-09-09: 1 mg via INTRAVENOUS

## 2023-09-09 MED ORDER — CEFAZOLIN SODIUM-DEXTROSE 2-4 GM/100ML-% IV SOLN
INTRAVENOUS | Status: AC
  Filled 2023-09-09: qty 100

## 2023-09-09 MED ORDER — IOHEXOL 300 MG/ML  SOLN
INTRAMUSCULAR | Status: DC | PRN
  Administered 2023-09-09: 15 mL

## 2023-09-09 MED ORDER — ACETAMINOPHEN 325 MG PO TABS: 325.0000 mg | ORAL_TABLET | ORAL | Status: DC | PRN

## 2023-09-09 MED ORDER — SODIUM CHLORIDE 0.9 % IV SOLN: INTRAVENOUS | Status: DC

## 2023-09-09 MED ORDER — MONTELUKAST SODIUM 10 MG PO TABS: 10.0000 mg | ORAL_TABLET | Freq: Every day | ORAL | Status: DC

## 2023-09-09 MED ORDER — LIDOCAINE HCL (PF) 1 % IJ SOLN
INTRAMUSCULAR | Status: DC | PRN
  Administered 2023-09-09: 20 mL via SUBCUTANEOUS

## 2023-09-09 MED ORDER — FENTANYL CITRATE (PF) 100 MCG/2ML IJ SOLN
INTRAMUSCULAR | Status: DC | PRN
  Administered 2023-09-09: 25 ug via INTRAVENOUS

## 2023-09-09 MED ORDER — CEPHALEXIN 500 MG PO CAPS: 500.0000 mg | ORAL_CAPSULE | Freq: Two times a day (BID) | ORAL | 0 refills | Status: DC

## 2023-09-09 MED ORDER — SODIUM CHLORIDE 0.9 % IV SOLN
80.0000 mg | INTRAVENOUS | Status: AC
  Administered 2023-09-09: 80 mg
  Filled 2023-09-09: qty 2

## 2023-09-09 MED ORDER — HEPARIN (PORCINE) IN NACL 1000-0.9 UT/500ML-% IV SOLN
INTRAVENOUS | Status: DC | PRN
  Administered 2023-09-09: 500 mL

## 2023-09-09 MED ORDER — HEPARIN (PORCINE) IN NACL 1000-0.9 UT/500ML-% IV SOLN
INTRAVENOUS | Status: AC
  Filled 2023-09-09: qty 500

## 2023-09-09 MED ORDER — FLUTICASONE PROPIONATE 50 MCG/ACT NA SUSP: 2.0000 | Freq: Every day | NASAL | Status: DC

## 2023-09-09 MED ORDER — CEFAZOLIN SODIUM-DEXTROSE 1-4 GM/50ML-% IV SOLN: 1.0000 g | Freq: Three times a day (TID) | INTRAVENOUS | Status: DC

## 2023-09-09 MED ORDER — ALPRAZOLAM 0.5 MG PO TABS: 0.5000 mg | ORAL_TABLET | Freq: Three times a day (TID) | ORAL | Status: DC | PRN

## 2023-09-09 MED ORDER — FENTANYL CITRATE (PF) 100 MCG/2ML IJ SOLN
INTRAMUSCULAR | Status: AC
  Filled 2023-09-09: qty 2

## 2023-09-09 SURGICAL SUPPLY — 19 items
CABLE SURG 12 DISP A/V CHANNEL (MISCELLANEOUS) IMPLANT
DEVICE DSSCT PLSMBLD 3.0S LGHT (MISCELLANEOUS) IMPLANT
DRAPE INCISE 23X17 STRL (DRAPES) IMPLANT
DRAPE INCISE IOBAN 23X17 STRL (DRAPES) ×1 IMPLANT
IPG PACE AZUR XT DR MRI W1DR01 (Pacemaker) IMPLANT
KIT SYRINGE INJ CVI SPIKEX1 (MISCELLANEOUS) IMPLANT
LEAD CAPSURE NOVUS 5076-52CM (Lead) IMPLANT
LEAD CAPSURE NOVUS 5076-58CM (Lead) IMPLANT
PACE AZURE XT DR MRI W1DR01 (Pacemaker) ×1 IMPLANT
PAD ELECT DEFIB RADIOL ZOLL (MISCELLANEOUS) IMPLANT
PLASMABLADE 3.0S W/LIGHT (MISCELLANEOUS) ×1
SHEATH 7FR PRELUDE SNAP 13 (SHEATH) IMPLANT
SHEATH 9FR PRELUDE SNAP 13 (SHEATH) IMPLANT
SLING ARM IMMOBILIZER LRG (SOFTGOODS) IMPLANT
SUT SILK 0 FSL (SUTURE) IMPLANT
SUT VIC AB 2-0 CT1 27 (SUTURE) ×1
SUT VIC AB 2-0 CT1 TAPERPNT 27 (SUTURE) IMPLANT
SUT VIC AB 4-0 PS2 18 (SUTURE) IMPLANT
TRAY PACEMAKER INSERTION (PACKS) ×1 IMPLANT

## 2023-09-09 NOTE — Discharge Instructions (Signed)
For 6 weeks, avoid lifting greater than 15 pounds or raising your left arm above your head. Please do not shower until tomorrow and do not submerge your left chest in water (no baths, swimming) for at least 1 week or until you follow up with Dr. Darrold Junker. You can remove the clear bandage on your left chest if it starts to peel off, but please do NOT take off the steri strips underneath (thin rectangular strips). Take all your medicines as prescribed, including the antibiotic I have prescribed called Keflex (cefalexin). Please call Dr. Darrold Junker 's office at [facility] [phone]) or if you have any questions or concerns.

## 2023-09-09 NOTE — Consult Note (Signed)
[name] Cardiology  CARDIOLOGY CONSULT NOTE  Patient ID: Jesus Blevins MRN: [id number] DOB/AGE: November 10, 1946 77 y.o.  Admit date: 09/09/2023 Referring Physician  Primary Physician Graciela Husbands Primary Cardiologist Leanza Shepperson Reason for Consultation sick sinus syndrome  HPI: 77 year old gentleman presents for elective pacemaker for sick sinus syndrome.  Patient has history of known coronary artery disease, with 50% stenosis left circumflex and 60% stenosis large OM1 06/05/2023.  Patient reports history of lack of exercise tolerance.  72-hour Holter monitor 07/01/2023 - 07/04/2023 revealed predominant sinus bradycardia with mean heart rate of 47 bpm, sinus heart rate range 34 to 97 bpm with frequent premature ventricular contractions.  Review of systems complete and found to be negative unless listed above     Past Medical History:  Diagnosis Date   Allergic rhinitis    Anemia    Anxiety    Coronary artery disease    Depression    Diverticulosis    GERD (gastroesophageal reflux disease)    History of kidney stones    Hypercalcemia    Hypertension    IBS (irritable colon syndrome)    Osteoarthritis    Restless leg    Sinusitis     Past Surgical History:  Procedure Laterality Date   BACK SURGERY     COLONOSCOPY     COLONOSCOPY N/A 04/21/2015   Procedure: COLONOSCOPY;  Surgeon: Scot Jun, MD;  Location: [facility] ENDOSCOPY;  Service: Endoscopy;  Laterality: N/A;   COLONOSCOPY WITH PROPOFOL N/A 08/05/2018   Procedure: COLONOSCOPY WITH PROPOFOL;  Surgeon: Scot Jun, MD;  Location: [facility] ENDOSCOPY;  Service: Endoscopy;  Laterality: N/A;   I & D EXTREMITY Right 02/22/2021   Procedure: IRRIGATION AND DEBRIDEMENT EXTREMITY;  Surgeon: Kennedy Bucker, MD;  Location: [facility] ORS;  Service: Orthopedics;  Laterality: Right;   JOINT REPLACEMENT Left 2017   knee arthoscopy Right    LEFT HEART CATH AND CORONARY ANGIOGRAPHY Left 06/05/2023   Procedure: LEFT HEART CATH AND CORONARY ANGIOGRAPHY;  Surgeon:  Marcina Millard, MD;  Location: [facility] INVASIVE CV LAB;  Service: Cardiovascular;  Laterality: Left;   Lumb disectomy cervicle/lumbar multiple levels     OPEN REDUCTION INTERNAL FIXATION (ORIF) METACARPAL Right 02/22/2021   Procedure: Irrigation & Debridement, ORIF right thumb fracture;  Surgeon: Kennedy Bucker, MD;  Location: [facility] ORS;  Service: Orthopedics;  Laterality: Right;   sinus septum and polyp surgery     spine surgery L3-L5     Thumb trigger release Left    tongue polyp excision     TOTAL KNEE ARTHROPLASTY Left 04/16/2016   Procedure: TOTAL KNEE ARTHROPLASTY;  Surgeon: Juanell Fairly, MD;  Location: [facility] ORS;  Service: Orthopedics;  Laterality: Left;   TRANSFORAMINAL LUMBAR INTERBODY FUSION (TLIF) WITH PEDICLE SCREW FIXATION 1 LEVEL Right 08/25/2018   Procedure: Right Lumbar 4-5 Transforaminal lumbar interbody fusion with Lumbar 4 Bilateral Foraminotomies;  Surgeon: Jadene Pierini, MD;  Location: [facility] OR;  Service: Neurosurgery;  Laterality: Right;  Right Lumbar 4-5 Transforaminal lumbar interbody fusion with Lumbar 4 Bilateral Foraminotomies   TRIGGER FINGER RELEASE Left 11/06/2017   Procedure: RELEASE TRIGGER FINGER;  Surgeon: Juanell Fairly, MD;  Location: [facility] ORS;  Service: Orthopedics;  Laterality: Left;    Medications Prior to Admission  Medication Sig Dispense Refill Last Dose   ALPRAZolam (XANAX) 0.5 MG tablet Take 0.5 mg 2 (two) times daily as needed by mouth for anxiety (takes 1 dose every morning and another dose only if needed).    09/08/2023   Amino Acids (AMINO ACID PO)  Take 1 tablet by mouth daily.   09/08/2023   atorvastatin (LIPITOR) 40 MG tablet Take 40 mg by mouth at bedtime.    09/08/2023   azelastine (ASTELIN) 0.1 % nasal spray Place 1 spray into both nostrils at bedtime. Use in each nostril as directed   09/09/2023 at 0500   B Complex-C (B-COMPLEX WITH VITAMIN C) tablet Take 1 tablet by mouth daily.   09/08/2023   Biotin 16109 MCG TABS Take 10,000 mcg daily by  mouth.   09/08/2023   Cholecalciferol 2000 units CAPS Take 2,000 Units daily by mouth.   09/08/2023   fexofenadine (ALLEGRA) 180 MG tablet Take 180 mg by mouth daily.   09/08/2023   Flax Oil-Fish Oil-Borage Oil (FISH OIL-FLAX OIL-BORAGE OIL PO) Take 1 capsule by mouth 2 (two) times daily. 800 mg fish oil  800 mg flaxseed oil 400 mg Borage oil   09/08/2023   fluticasone (FLONASE) 50 MCG/ACT nasal spray Place 2 sprays daily into both nostrils.    09/08/2023   Glucosamine-Chondroit-Vit C-Mn (GLUCOSAMINE CHONDR 1500 COMPLX) CAPS Take 1 capsule by mouth 2 (two) times daily. Cosamine ASU   09/08/2023   losartan (COZAAR) 50 MG tablet Take 50 mg by mouth daily.   09/08/2023   montelukast (SINGULAIR) 10 MG tablet Take 10 mg by mouth at bedtime.   09/08/2023   Multiple Minerals-Vitamins (CALCIUM-MAGNESIUM-ZINC-D3) TABS Take 1 tablet by mouth at bedtime.    09/08/2023   Multiple Vitamin (MULTIVITAMIN WITH MINERALS) TABS tablet Take 0.5 tablets by mouth 2 (two) times daily.   09/08/2023   omeprazole (PRILOSEC) 40 MG capsule Take 20 mg by mouth daily as needed (when traveling).    09/08/2023   sertraline (ZOLOFT) 50 MG tablet Take 50 mg by mouth at bedtime.    09/08/2023   amoxicillin (AMOXIL) 125 MG chewable tablet Chew 125 mg by mouth See admin instructions. Takes 1 capsule 125 mg 1 hour prior to denal procedure (Patient not taking: Reported on 09/09/2023)   Completed Course   Coenzyme Q10 (CO Q 10 PO) Take 200 mg daily by mouth.      cyclobenzaprine (FLEXERIL) 10 MG tablet Take 1 tablet (10 mg total) by mouth 3 (three) times daily as needed for muscle spasms. 30 tablet 0    Misc Natural Products (LUTEIN 20) CAPS Take 20 mg daily by mouth.      ondansetron (ZOFRAN) 4 MG tablet Take 1 tablet (4 mg total) by mouth every 8 (eight) hours as needed for nausea or vomiting. 30 tablet 0    Social History   Socioeconomic History   Marital status: Widowed    Spouse name: Not on file   Number of children: Not on file   Years  of education: Not on file   Highest education level: Not on file  Occupational History   Not on file  Tobacco Use   Smoking status: Never   Smokeless tobacco: Former    Types: Chew    Quit date: 11/03/2001  Vaping Use   Vaping status: Never Used  Substance and Sexual Activity   Alcohol use: Not Currently   Drug use: No    Comment: CBD oil daily   Sexual activity: Not on file  Other Topics Concern   Not on file  Social History Narrative   Lives alone and does have significant other and daughter.   Social Determinants of Health   Financial Resource Strain: Not on file  Food Insecurity: Not on file  Transportation Needs: Not  on file  Physical Activity: Not on file  Stress: Not on file  Social Connections: Not on file  Intimate Partner Violence: Not on file    History reviewed. No pertinent family history.    Review of systems complete and found to be negative unless listed above      PHYSICAL EXAM  General: Well developed, well nourished, in no acute distress HEENT:  Normocephalic and atramatic Neck:  No JVD.  Lungs: Clear bilaterally to auscultation and percussion. Heart: HRRR . Normal S1 and S2 without gallops or murmurs.  Abdomen: Bowel sounds are positive, abdomen soft and non-tender  Msk:  Back normal, normal gait. Normal strength and tone for age. Extremities: No clubbing, cyanosis or edema.   Neuro: Alert and oriented X 3. Psych:  Good affect, responds appropriately  Labs:   Lab Results  Component Value Date   WBC 7.1 08/25/2018   HGB 13.6 08/25/2018   HCT 40.6 08/25/2018   MCV 90.4 08/25/2018   PLT 173 08/25/2018   No results for input(s): "NA", "K", "CL", "CO2", "BUN", "CREATININE", "CALCIUM", "PROT", "BILITOT", "ALKPHOS", "ALT", "AST", "GLUCOSE" in the last 168 hours.  Invalid input(s): "LABALBU" No results found for: "CKTOTAL", "CKMB", "CKMBINDEX", "TROPONINI" No results found for: "CHOL" No results found for: "HDL" No results found for:  "LDLCALC" No results found for: "TRIG" No results found for: "CHOLHDL" No results found for: "LDLDIRECT"    Radiology: MR LUMBAR SPINE WO CONTRAST  Result Date: 08/28/2023 CLINICAL DATA:  Chronic low back pain, history of surgery in 2019 EXAM: MRI LUMBAR SPINE WITHOUT CONTRAST TECHNIQUE: Multiplanar, multisequence MR imaging of the lumbar spine was performed. No intravenous contrast was administered. COMPARISON:  09/04/2019 FINDINGS: Segmentation:  5 lumbar type vertebral bodies. Alignment: 2 mm retrolisthesis of L2 on L3. 3 mm retrolisthesis of L3 on L4. Mild levocurvature. Vertebrae: No acute fracture, evidence of discitis, or suspicious osseous lesion. Status post L4-L5 posterior fusion. Susceptibility artifact from the hardware limits evaluation of these levels. Conus medullaris and cauda equina: Conus extends to the L1 level. Conus and cauda equina appear normal. Paraspinal and other soft tissues: No acute finding. Disc levels: T12-L1: No significant disc bulge. No spinal canal stenosis or neural foraminal narrowing. L1-L2: Minimal disc bulge. No spinal canal stenosis or neural foraminal narrowing. L2-L3: Trace retrolisthesis and mild disc bulge. Mild facet arthropathy. No spinal canal stenosis. Mild-to-moderate right and mild left neural foraminal, slightly progressed on the right. L3-L4: Trace retrolisthesis and mild disc bulge. Mild facet arthropathy. Prior right hemilaminectomy. Narrowing of the lateral recesses. No spinal canal stenosis. Moderate to severe right and moderate left neural foraminal narrowing, similar to prior. L4-L5: Status post fusion and decompression. No spinal canal stenosis. Moderate right neural foraminal narrowing, unchanged. L5-S1: Mild disc bulge. Moderate facet arthropathy. No spinal canal stenosis or neural foraminal narrowing. IMPRESSION: 1. L2-L3 mild-to-moderate right and mild left neural foraminal narrowing, slightly progressed on the right. 2. L3-L4 moderate to severe  right and moderate left neural foraminal narrowing, similar to prior. Narrowing of the lateral recesses at this level could affect the descending L4 nerve roots. 3. L4-L5 moderate right neural foraminal narrowing, unchanged. Electronically Signed   By: Wiliam Ke M.D.   On: 08/28/2023 03:18    EKG: Sinus bradycardia 47 bpm  ASSESSMENT AND PLAN:   1.  Sick sinus syndrome 2.  Nonobstructive coronary artery disease  Recommendations  1.  Proceed with dual-chamber pacemake implant.  The risk, benefits alternatives of permanent pacemaker implantation were explained  to the patient and informed consent was obtained.  Signed: Marcina Millard MD,PhD, [facility] 09/09/2023, 7:33 AM

## 2023-09-10 ENCOUNTER — Encounter: Payer: Self-pay | Admitting: Cardiology

## 2024-03-05 ENCOUNTER — Other Ambulatory Visit: Payer: Self-pay | Admitting: Neurosurgery

## 2024-03-05 DIAGNOSIS — M5416 Radiculopathy, lumbar region: Secondary | ICD-10-CM

## 2024-03-10 ENCOUNTER — Encounter: Payer: Self-pay | Admitting: Neurosurgery

## 2024-03-11 ENCOUNTER — Ambulatory Visit
Admission: RE | Admit: 2024-03-11 | Discharge: 2024-03-11 | Disposition: A | Discharge status: Home / Self Care | Source: Ambulatory Visit | Attending: Neurosurgery

## 2024-03-11 DIAGNOSIS — M5416 Radiculopathy, lumbar region: Secondary | ICD-10-CM

## 2024-05-04 ENCOUNTER — Other Ambulatory Visit: Payer: Self-pay | Admitting: Neurosurgery

## 2024-05-12 NOTE — Pre-Procedure Instructions (Addendum)
 Surgical Instructions   Your procedure is scheduled on May 20, 2024. Report to [facility] Main Entrance "A" at 5:30 A.M., then check in with the Admitting office. Any questions or running late day of surgery: call [phone]  Questions prior to your surgery date: call [phone], Monday-Friday, 8am-4pm. If you experience any cold or flu symptoms such as cough, fever, chills, shortness of breath, etc. between now and your scheduled surgery, please notify us  at the above number.     Remember:  Do not eat or drink after midnight the night before your surgery    Take these medicines the morning of surgery with A SIP OF WATER: ALPRAZolam  (XANAX ) fexofenadine (ALLEGRA)  fluticasone  (FLONASE ) nasal spray    May take these medicines IF NEEDED: omeprazole (PRILOSEC)  ondansetron  (ZOFRAN )    STOP taking your Aspirin  one week prior to surgery. Your last dose will be June 4th.   One week prior to surgery, STOP taking any Aleve, Naproxen, Ibuprofen, Motrin, Advil, Goody's, BC's, all herbal medications, fish oil, and non-prescription vitamins.                     Do NOT Smoke (Tobacco/Vaping) for 24 hours prior to your procedure.  If you use a CPAP at night, you may bring your mask/headgear for your overnight stay.   You will be asked to remove any contacts, glasses, piercing's, hearing aid's, dentures/partials prior to surgery. Please bring cases for these items if needed.    Patients discharged the day of surgery will not be allowed to drive home, and someone needs to stay with them for 24 hours.  SURGICAL WAITING ROOM VISITATION Patients may have no more than 2 support people in the waiting area - these visitors may rotate.   Pre-op nurse will coordinate an appropriate time for 1 ADULT support person, who may not rotate, to accompany patient in pre-op.  Children under the age of 57 must have an adult with them who is not the patient and must remain in the main waiting area with  an adult.  If the patient needs to stay at the hospital during part of their recovery, the visitor guidelines for inpatient rooms apply.  Please refer to the [facility] website for the visitor guidelines for any additional information.   If you received a COVID test during your pre-op visit  it is requested that you wear a mask when out in public, stay away from anyone that may not be feeling well and notify your surgeon if you develop symptoms. If you have been in contact with anyone that has tested positive in the last 10 days please notify you surgeon.      Pre-operative 5 CHG Bathing Instructions   You can play a key role in reducing the risk of infection after surgery. Your skin needs to be as free of germs as possible. You can reduce the number of germs on your skin by washing with CHG (chlorhexidine  gluconate) soap before surgery. CHG is an antiseptic soap that kills germs and continues to kill germs even after washing.   DO NOT use if you have an allergy to chlorhexidine /CHG or antibacterial soaps. If your skin becomes reddened or irritated, stop using the CHG and notify one of our RNs at [phone].   Please shower with the CHG soap starting 4 days before surgery using the following schedule:     Please keep in mind the following:  DO NOT shave, including legs and underarms, starting  the day of your first shower.   You may shave your face at any point before/day of surgery.  Place clean sheets on your bed the day you start using CHG soap. Use a clean washcloth (not used since being washed) for each shower. DO NOT sleep with pets once you start using the CHG.   CHG Shower Instructions:  Wash your face and private area with normal soap. If you choose to wash your hair, wash first with your normal shampoo.  After you use shampoo/soap, rinse your hair and body thoroughly to remove shampoo/soap residue.  Turn the water OFF and apply about 3 tablespoons (45 ml) of CHG soap to a  CLEAN washcloth.  Apply CHG soap ONLY FROM YOUR NECK DOWN TO YOUR TOES (washing for 3-5 minutes)  DO NOT use CHG soap on face, private areas, open wounds, or sores.  Pay special attention to the area where your surgery is being performed.  If you are having back surgery, having someone wash your back for you may be helpful. Wait 2 minutes after CHG soap is applied, then you may rinse off the CHG soap.  Pat dry with a clean towel  Put on clean clothes/pajamas   If you choose to wear lotion, please use ONLY the CHG-compatible lotions that are listed below.  Additional instructions for the day of surgery: DO NOT APPLY any lotions, deodorants, cologne, or perfumes.   Do not bring valuables to the hospital. [facility] is not responsible for any belongings/valuables. Do not wear nail polish, gel polish, artificial nails, or any other type of covering on natural nails (fingers and toes) Do not wear jewelry or makeup Put on clean/comfortable clothes.  Please brush your teeth.  Ask your nurse before applying any prescription medications to the skin.     CHG Compatible Lotions   Aveeno Moisturizing lotion  Cetaphil Moisturizing Cream  Cetaphil Moisturizing Lotion  Clairol Herbal Essence Moisturizing Lotion, Dry Skin  Clairol Herbal Essence Moisturizing Lotion, Extra Dry Skin  Clairol Herbal Essence Moisturizing Lotion, Normal Skin  Curel Age Defying Therapeutic Moisturizing Lotion with Alpha Hydroxy  Curel Extreme Care Body Lotion  Curel Soothing Hands Moisturizing Hand Lotion  Curel Therapeutic Moisturizing Cream, Fragrance-Free  Curel Therapeutic Moisturizing Lotion, Fragrance-Free  Curel Therapeutic Moisturizing Lotion, Original Formula  Eucerin Daily Replenishing Lotion  Eucerin Dry Skin Therapy Plus Alpha Hydroxy Crme  Eucerin Dry Skin Therapy Plus Alpha Hydroxy Lotion  Eucerin Original Crme  Eucerin Original Lotion  Eucerin Plus Crme Eucerin Plus Lotion  Eucerin TriLipid  Replenishing Lotion  Keri Anti-Bacterial Hand Lotion  Keri Deep Conditioning Original Lotion Dry Skin Formula Softly Scented  Keri Deep Conditioning Original Lotion, Fragrance Free Sensitive Skin Formula  Keri Lotion Fast Absorbing Fragrance Free Sensitive Skin Formula  Keri Lotion Fast Absorbing Softly Scented Dry Skin Formula  Keri Original Lotion  Keri Skin Renewal Lotion Keri Silky Smooth Lotion  Keri Silky Smooth Sensitive Skin Lotion  Nivea Body Creamy Conditioning Oil  Nivea Body Extra Enriched Lotion  Nivea Body Original Lotion  Nivea Body Sheer Moisturizing Lotion Nivea Crme  Nivea Skin Firming Lotion  NutraDerm 30 Skin Lotion  NutraDerm Skin Lotion  NutraDerm Therapeutic Skin Cream  NutraDerm Therapeutic Skin Lotion  ProShield Protective Hand Cream  Provon moisturizing lotion  Please read over the following fact sheets that you were given.

## 2024-05-13 ENCOUNTER — Other Ambulatory Visit: Payer: Self-pay

## 2024-05-13 ENCOUNTER — Encounter (HOSPITAL_COMMUNITY): Payer: Self-pay

## 2024-05-13 ENCOUNTER — Encounter (HOSPITAL_COMMUNITY)
Admission: RE | Admit: 2024-05-13 | Discharge: 2024-05-13 | Disposition: A | Discharge status: Home / Self Care | Source: Ambulatory Visit | Attending: Neurosurgery | Admitting: Neurosurgery

## 2024-05-13 VITALS — BP 132/78 | HR 78 | Temp 97.9°F | Resp 18 | Ht 70.0 in | Wt 212.0 lb

## 2024-05-13 DIAGNOSIS — I495 Sick sinus syndrome: Secondary | ICD-10-CM | POA: Insufficient documentation

## 2024-05-13 DIAGNOSIS — M5116 Intervertebral disc disorders with radiculopathy, lumbar region: Secondary | ICD-10-CM | POA: Diagnosis not present

## 2024-05-13 DIAGNOSIS — Z01818 Encounter for other preprocedural examination: Secondary | ICD-10-CM | POA: Insufficient documentation

## 2024-05-13 HISTORY — DX: Cardiac arrhythmia, unspecified: I49.9

## 2024-05-13 HISTORY — DX: Pneumonia, unspecified organism: J18.9

## 2024-05-13 HISTORY — DX: Presence of cardiac pacemaker: Z95.0

## 2024-05-13 LAB — BASIC METABOLIC PANEL WITH GFR
Anion gap: 7 (ref 5–15)
BUN: 13 mg/dL (ref 8–23)
CO2: 23 mmol/L (ref 22–32)
Calcium: 9.7 mg/dL (ref 8.9–10.3)
Chloride: 108 mmol/L (ref 98–111)
Creatinine, Ser: 0.93 mg/dL (ref 0.61–1.24)
GFR, Estimated: >60 mL/min (ref >60)
Glucose, Bld: 150 mg/dL — ABNORMAL HIGH (ref 70–99)
Potassium: 3.9 mmol/L (ref 3.5–5.1)
Sodium: 138 mmol/L (ref 135–145)

## 2024-05-13 LAB — TYPE AND SCREEN
ABO/RH(D): O POS
Antibody Screen: NEGATIVE

## 2024-05-13 LAB — CBC
HCT: 38.9 % — ABNORMAL LOW (ref 39.0–52.0)
Hemoglobin: 13.3 g/dL (ref 13.0–17.0)
MCH: 30.7 pg (ref 26.0–34.0)
MCHC: 34.2 g/dL (ref 30.0–36.0)
MCV: 89.8 fL (ref 80.0–100.0)
Platelets: 182 10*3/uL (ref 150–400)
RBC: 4.33 MIL/uL (ref 4.22–5.81)
RDW: 12.3 % (ref 11.5–15.5)
WBC: 6.6 10*3/uL (ref 4.0–10.5)
nRBC: 0 % (ref 0.0–0.2)

## 2024-05-13 LAB — SURGICAL PCR SCREEN
MRSA, PCR: NEGATIVE
Staphylococcus aureus: NEGATIVE

## 2024-05-13 NOTE — Progress Notes (Signed)
 PCP - Dr. Eddy Goodell Cardiologist - Dr. Percival Brace - Last office visit 12/17/2023  PPM/ICD - [employer] PPM Device Orders - Device orders faxed 6/4 and 6/5. Pending response Rep Notified - Rep emailed 6/5 with date, time, pt arrival time and type of surgery  Chest x-ray - n/a EKG - 09/17/2023 (CE) - tracing requested Stress Test - 05/14/2023 ECHO - 08/19/2023 Cardiac Cath - 06/05/2023  Sleep Study - Denies CPAP - n/a  No DM  Last dose of GLP1 agonist- n/a GLP1 instructions: n/a  Blood Thinner Instructions: n/a Aspirin  Instructions: Pt instructed to hold ASA for one week. Last dose 6/3  NPO after midnight  COVID TEST- n/a   Anesthesia review: Yes. EKG tracing requested. Medtr[employer] PPM, HTN, CAD. Per Dr. Linn Rich last note, cardiac clearance needs to be requested, but none seen in chart. Pt instructed to reach out to Cardiologist regarding cardiac clearance.   Patient denies shortness of breath, fever, cough and chest pain at PAT appointment. Pt denies any respiratory illness/infection in the last two months.    All instructions explained to the patient, with a verbal understanding of the material. Patient agrees to go over the instructions while at home for a better understanding. Patient also instructed to self quarantine after being tested for COVID-19. The opportunity to ask questions was provided.

## 2024-05-14 NOTE — Progress Notes (Addendum)
 Anesthesia Chart Review:  Case: [id number] Date/Time: 05/20/24 0715   Procedures:      ANTERIOR LATERAL LUMBAR FUSION 1 LEVEL (Right) - DLIF, RT, PRONE TRANSPSOAS, EXTENSION OF POSTERIOR FUSION L3-L4     APPLICATION OF O-ARM (Right)   Anesthesia type: General   Diagnosis: Lumbar radiculopathy [M54.16]   Pre-op diagnosis: LUMBAR RADICULOPATHY   [facility] / [facility]   Surgeons: Van Gelinas, MD       DISCUSSION: Patient is a 78 year old Blevins scheduled for the above procedure.   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  Evaluated for annual Wellness exam by Dr. Eddy Goodell on 05/05/24. HE wrote, Proceed with planned anterolateral lumbar fusion surgery.  Last cardiology follow-up was on 12/17/23 by Barton Like, PA. He had a 2 year history of bradycardia and decreased exercise tolerance.. He had heaviness in his chest with swimming laps. He had a positive stress test on 05/14/23 followed by [facility] on 06/04/24 showing 20% pRCA, 50% pCX, 60% OM1, 40% mLAD, LVEF 55-65%. 72 hour Holter monitor showed predominant SB with HR 34-97 bpm, mean HR of 47 bpm, 3% PVCs, infrequent brief atrial runs. He underwent dual chamber Medtronic PPM on 09/09/23 for SSS. TTE on 08/19/23 showed EF 55%, mild MR, TR, normal LV/RV systolic function. At 12/17/23 visit PPM interrogation showed normal function with 13 year longevity with underlying SB. No changes made. He had started back swimming. Six month follow-up planned.   Awaiting EP perioerpative device recommendationsi and last EKG tracing from [facility]. (UPDATE 05/19/24 6:43 PM: Another fax request sent on 05/18/24 and call request on 05/19/24 for [facility] Cardiologist to send last EKG and PPM Perioperative device form, but  not additional records received.)   VS: BP 132/78   Pulse 78   Temp 36.6 C   Resp 18   Ht 5' 10 (1.778 m)   Wt 96.2 kg   SpO2 97%   BMI 30.42 kg/m    PROVIDERS: Melchor Spoon, MD is PCP  Percival Brace, MD is cardiologist   LABS: Labs reviewed: Acceptable for surgery. (all labs ordered are listed, but only abnormal results are displayed)  Labs Reviewed  CBC - Abnormal; Notable for the following components:      Result Value   HCT 38.9 (*)    All other components within normal limits  BASIC METABOLIC PANEL WITH GFR - Abnormal; Notable for the following components:   Glucose, Bld 150 (*)    All other components within normal limits  SURGICAL PCR SCREEN  TYPE AND SCREEN     IMAGES: CT L-spine 03/11/24: IMPRESSION: 1. Unchanged postoperative appearance from prior L4-5 interbody and posterior spinal fusion with solid bony fusion across the intervening disc space and posterior elements. No hardware complication. 2. Adjacent segment degeneration at L3-4 with moderate spinal canal stenosis and severe bilateral neural foraminal narrowing. 3. Moderate right neural foraminal narrowing at L2-L3 and moderate left neural foraminal narrowing at L5-S1.   MRI L-spine 08/12/23: IMPRESSION: 1. L2-L3 mild-to-moderate right and mild left neural foraminal narrowing, slightly progressed on the right. 2. L3-L4 moderate to severe right and moderate left neural foraminal narrowing, similar to prior. Narrowing of the lateral recesses at this level could affect the descending L4 nerve roots. 3. L4-L5 moderate right neural foraminal  narrowing, unchanged.    EKG: EKG 09/17/23 ([facility]): Tracing requested. Per Narrative in [facility]: Atrial-paced rhythm  Abnormal ECG  When compared with ECG of 17-Apr-2022 11:18,  Electronic atrial pacemaker has replaced Sinus rhythm  Nonspecific T wave abnormalities no longer evident in Anterior leads  I reviewed and concur with this report.  Electronically signed ZO:[email or web address], MD, ALEX [id number]) on 09/19/2023 11:03:31 AM    CV: Echo 08/19/23 ([facility]): INTERPRETATION  NORMAL LEFT VENTRICULAR SYSTOLIC FUNCTION   WITH MILD LVH  NORMAL RIGHT VENTRICULAR SYSTOLIC FUNCTION  MILD VALVULAR REGURGITATION (MILD MR, TR)  NO VALVULAR STENOSIS  LA MILDLY DILATED  EF 55%    72-hour Holter monitor 07/01/2023 - 07/04/2023 revealed predominant sinus bradycardia with mean heart rate of 47 bpm, sinus heart rate range 34 to 97 bpm. Patient with premature ventricular contractions (3%) were observed. Infrequent atrial runs were observed the longest lasting 3 beats. There were no diary entries.  The patient underwent Medtronic dual-chamber pacemaker implantation on 09/09/2023 for sick sinus syndrome.  Cardiac cath 06/05/23:   Prox RCA lesion is 20% stenosed.   Prox Cx lesion is 50% stenosed.   1st Mrg lesion is 60% stenosed.   Mid LAD lesion is 40% stenosed.   The left ventricular systolic function is normal.   LV end diastolic pressure is normal.   The left ventricular ejection fraction is 55-65% by visual estimate.   1.  Insignificant coronary artery disease with 50% stenosis proximal left circumflex, 60% stenosis large OM1 2.  Normal left ventricular function 3.  Sinus bradycardia 43 bpm   Recommendations 1.  Medical therapy 2.  Aggressive risk factor modification 3.  Consider evaluation for dual-chamber pacemaker   Nuclear stress test 05/14/23 ([facility]): 1.  Positive ETT  2.  Mildly reduced left ventricular function  3.  Apical hypokinesis  4.  Mild reversible anterior apical and inferior apical defect consistent  with mild ischemia    Past Medical History:  Diagnosis Date   Allergic rhinitis    Anemia    Anxiety    Coronary artery disease    Depression    Diverticulosis    Dysrhythmia    Sick Sinus Syndrome s/p PPPM Implant   GERD (gastroesophageal reflux disease)    History of kidney stones 1986   Hypercalcemia     Hypertension    IBS (irritable colon syndrome)    Osteoarthritis    Pneumonia    In High School   Presence of permanent cardiac pacemaker    Medtronic   Restless leg    Sinusitis     Past Surgical History:  Procedure Laterality Date   COLONOSCOPY     COLONOSCOPY N/A 04/21/2015   Procedure: COLONOSCOPY;  Surgeon: Cassie Click, MD;  Location: [facility] ENDOSCOPY;  Service: Endoscopy;  Laterality: N/A;   COLONOSCOPY WITH PROPOFOL  N/A 08/05/2018   Procedure: COLONOSCOPY WITH PROPOFOL ;  Surgeon: Cassie Click, MD;  Location: [facility] ENDOSCOPY;  Service: Endoscopy;  Laterality: N/A;   I & D EXTREMITY Right 02/22/2021   Procedure: IRRIGATION AND DEBRIDEMENT EXTREMITY;  Surgeon: Molli Angelucci, MD;  Location: [facility] ORS;  Service: Orthopedics;  Laterality: Right;   KNEE ARTHROSCOPY Right    LEFT HEART CATH AND CORONARY ANGIOGRAPHY Left 06/05/2023   Procedure: LEFT HEART CATH AND CORONARY ANGIOGRAPHY;  Surgeon: Percival Brace, MD;  Location: [facility] INVASIVE CV LAB;  Service: Cardiovascular;  Laterality: Left;   Lumb disectomy cervicle/lumbar multiple levels     OPEN REDUCTION INTERNAL FIXATION (ORIF) METACARPAL  Right 02/22/2021   Procedure: Irrigation & Debridement, ORIF right thumb fracture;  Surgeon: Molli Angelucci, MD;  Location: [facility] ORS;  Service: Orthopedics;  Laterality: Right;   PACEMAKER IMPLANT N/A 09/09/2023   Procedure: PACEMAKER IMPLANT;  Surgeon: Percival Brace, MD;  Location: [facility] INVASIVE CV LAB;  Service: Cardiovascular;  Laterality: N/A;   sinus septum and polyp surgery     spine surgery L3-L5     tongue polyp excision     TOTAL KNEE ARTHROPLASTY Left 04/16/2016   Procedure: TOTAL KNEE ARTHROPLASTY;  Surgeon: Rande Bushy, MD;  Location: [facility] ORS;  Service: Orthopedics;  Laterality: Left;   TRANSFORAMINAL LUMBAR INTERBODY FUSION (TLIF) WITH PEDICLE SCREW FIXATION 1 LEVEL Right 08/25/2018   Procedure: Right Lumbar 4-5 Transforaminal lumbar interbody fusion with  Lumbar 4 Bilateral Foraminotomies;  Surgeon: Cannon Champion, MD;  [facility];  Service: Neurosurgery;  Laterality: Right;  Right Lumbar 4-5 Transforaminal lumbar interbody fusion with Lumbar 4 Bilateral Foraminotomies   TRIGGER FINGER RELEASE Left 11/06/2017   Procedure: RELEASE TRIGGER FINGER;  Surgeon: Rande Bushy, MD;  Location: [facility] ORS;  Service: Orthopedics;  Laterality: Left;    MEDICATIONS:  ALPRAZolam  (XANAX ) 0.5 MG tablet   Amino Acids (AMINO ACID PO)   atorvastatin  (LIPITOR) 40 MG tablet   Azelastine  HCl (ASTEPRO ) 0.15 % SOLN   B Complex-C (B-COMPLEX WITH VITAMIN C) tablet   cetirizine (ZYRTEC) 10 MG tablet   Cholecalciferol (VITAMIN D ) 50 MCG (2000 UT) tablet   Flax Oil-Fish Oil-Borage Oil (FISH OIL-FLAX OIL-BORAGE OIL PO)   fluticasone  (FLONASE ) 50 MCG/ACT nasal spray   Glucosamine-Chondroit-Vit C-Mn (GLUCOSAMINE CHONDR 1500 COMPLX) CAPS   ketorolac  (ACULAR ) 0.5 % ophthalmic solution   losartan  (COZAAR ) 100 MG tablet   montelukast  (SINGULAIR ) 10 MG tablet   Multiple Minerals-Vitamins (CALCIUM -MAGNESIUM -ZINC-D3) TABS   Multiple Vitamin (MULTIVITAMIN WITH MINERALS) TABS tablet   Multiple Vitamins-Minerals (VISION VITAMINS PO)   omeprazole (PRILOSEC) 20 MG capsule   OVER THE COUNTER MEDICATION   prednisoLONE acetate (PRED FORTE) 1 % ophthalmic suspension   Propylene Glycol (SYSTANE BALANCE) 0.6 % SOLN   sertraline  (ZOLOFT ) 50 MG tablet   No current facility-administered medications for this encounter.    Ella Gun, PA-C Surgical Short Stay/Anesthesiology [facility] Phone [phone] [facility] Phone [phone] 05/14/2024 4:18 PM

## 2024-05-14 NOTE — Anesthesia Preprocedure Evaluation (Addendum)
 Anesthesia Evaluation  Patient identified by MRN, date of birth, ID band Patient awake    Reviewed: Allergy & Precautions, NPO status , Patient's Chart, lab work & pertinent test results  Airway Mallampati: II  TM Distance: >3 FB Neck ROM: Full    Dental no notable dental hx.    Pulmonary neg pulmonary ROS   Pulmonary exam normal        Cardiovascular hypertension, Pt. on medications + CAD  + pacemaker  Rhythm:Regular Rate:Normal     Neuro/Psych   Anxiety Depression       GI/Hepatic Neg liver ROS,GERD  ,,  Endo/Other  negative endocrine ROS    Renal/GU negative Renal ROS  negative genitourinary   Musculoskeletal  (+) Arthritis , Osteoarthritis,    Abdominal Normal abdominal exam  (+)   Peds  Hematology  (+) Blood dyscrasia, anemia Lab Results      Component                Value               Date                      WBC                      6.6                 05/13/2024                HGB                      13.3                05/13/2024                HCT                      38.9 (L)            05/13/2024                MCV                      89.8                05/13/2024                PLT                      182                 05/13/2024              Anesthesia Other Findings   Reproductive/Obstetrics                              Anesthesia Physical Anesthesia Plan  ASA: 3  Anesthesia Plan: General   Post-op Pain Management: Tylenol  PO (pre-op)* and Celebrex PO (pre-op)*   Induction: Intravenous  PONV Risk Score and Plan: 2 and Ondansetron , Dexamethasone  and Treatment may vary due to age or medical condition  Airway Management Planned: Mask and Oral ETT  Additional Equipment: Arterial line  Intra-op Plan:   Post-operative Plan: Extubation in OR  Informed Consent: I have reviewed the patients History and Physical, chart, labs and discussed the procedure  including the  risks, benefits and alternatives for the proposed anesthesia with the patient or authorized representative who has indicated his/her understanding and acceptance.     Dental advisory given  Plan Discussed with: CRNA  Anesthesia Plan Comments: (PAT note written 05/14/2024 by Allison Zelenak, PA-C. Medtronic PPM. Cardiologist is with [facility]. Multiple requests (fax and phone call) for last EKG and PPM Rx form, but still not received as of 05/19/24 6:300 PM.   )        Anesthesia Quick Evaluation

## 2024-05-19 NOTE — H&P (Addendum)
 CC: leg pain  HPI:     Patient is a 78 y.o. male with hx of R L4-5 TLIF 08/2018 by Dr. Ali Antonio who developed progressive right sided radicular pain and back pain.  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   Nonsurgical measures failed to improve his symptoms adequately so he decided to proceed with surgery.    Patient Active Problem List   Diagnosis Date Noted   Lumbar radiculopathy 08/25/2018   Status post total left knee replacement 04/16/2016   Past Medical History:  Diagnosis Date   Allergic rhinitis    Anemia    Anxiety    Coronary artery disease    Depression    Diverticulosis    Dysrhythmia    Sick Sinus Syndrome s/p PPPM Implant   GERD (gastroesophageal reflux disease)    History of kidney stones 1986   Hypercalcemia    Hypertension    IBS (irritable colon syndrome)    Osteoarthritis    Pneumonia    In High School   Presence of permanent cardiac pacemaker    Medtronic   Restless leg    Sinusitis     Past Surgical History:  Procedure Laterality Date   COLONOSCOPY     COLONOSCOPY N/A 04/21/2015   Procedure: COLONOSCOPY;  Surgeon: Cassie Click, MD;  Location: [facility] ENDOSCOPY;  Service: Endoscopy;  Laterality: N/A;   COLONOSCOPY WITH PROPOFOL  N/A 08/05/2018   Procedure: COLONOSCOPY WITH PROPOFOL ;  Surgeon: Cassie Click, MD;  Location: [facility] ENDOSCOPY;  Service: Endoscopy;  Laterality: N/A;   I & D EXTREMITY Right 02/22/2021   Procedure: IRRIGATION AND DEBRIDEMENT EXTREMITY;  Surgeon: Molli Angelucci, MD;  Location: [facility] ORS;  Service: Orthopedics;  Laterality: Right;   KNEE ARTHROSCOPY Right    LEFT HEART CATH AND CORONARY ANGIOGRAPHY Left 06/05/2023   Procedure: LEFT HEART CATH AND CORONARY ANGIOGRAPHY;  Surgeon: Percival Brace, MD;  Location: [facility] INVASIVE CV LAB;  Service: Cardiovascular;  Laterality: Left;   Lumb disectomy cervicle/lumbar multiple levels     OPEN REDUCTION INTERNAL  FIXATION (ORIF) METACARPAL Right 02/22/2021   Procedure: Irrigation & Debridement, ORIF right thumb fracture;  Surgeon: Molli Angelucci, MD;  Location: [facility] ORS;  Service: Orthopedics;  Laterality: Right;   PACEMAKER IMPLANT N/A 09/09/2023   Procedure: PACEMAKER IMPLANT;  Surgeon: Percival Brace, MD;  Location: [facility] INVASIVE CV LAB;  Service: Cardiovascular;  Laterality: N/A;   sinus septum and polyp surgery     spine surgery L3-L5     tongue polyp excision     TOTAL KNEE ARTHROPLASTY Left 04/16/2016   Procedure: TOTAL KNEE ARTHROPLASTY;  Surgeon: Rande Bushy, MD;  Location: [facility] ORS;  Service: Orthopedics;  Laterality: Left;   TRANSFORAMINAL LUMBAR INTERBODY FUSION (TLIF) WITH PEDICLE SCREW FIXATION 1 LEVEL Right 08/25/2018   Procedure: Right Lumbar 4-5 Transforaminal lumbar interbody fusion with Lumbar 4 Bilateral Foraminotomies;  Surgeon: Cannon Champion, MD;  [facility];  Service: Neurosurgery;  Laterality: Right;  Right Lumbar 4-5 Transforaminal lumbar interbody fusion with Lumbar 4 Bilateral Foraminotomies   TRIGGER FINGER RELEASE Left 11/06/2017   Procedure: RELEASE TRIGGER FINGER;  Surgeon: Rande Bushy, MD;  Location: [facility] ORS;  Service: Orthopedics;  Laterality: Left;    No medications prior to admission.   Allergies  Allergen Reactions   Elavil [Amitriptyline] Anxiety   Sulfa Antibiotics Rash    Childhood allergy    Social History   Tobacco Use   Smoking status:  Never   Smokeless tobacco: Former    Types: Chew    Quit date: 11/03/2001  Substance Use Topics   Alcohol use: Not Currently    No family history on file.   Review of Systems Pertinent items are noted in HPI.  Objective:   No data found. No intake/output data recorded. No intake/output data recorded.      General : Alert, cooperative, no distress, appears stated age   Head:  Normocephalic/atraumatic    Eyes: PERRL, conjunctiva/corneas clear, EOM's intact. Fundi could not be  visualized Neck: Supple Chest:  Respirations unlabored Chest wall: no tenderness or deformity Heart: Regular rate and rhythm Abdomen: Soft, nontender and nondistended Extremities: warm and well-perfused Skin: normal turgor, color and texture Neurologic:  Alert, oriented x 3.  Eyes open spontaneously. PERRL, EOMI, VFC, no facial droop. V1-3 intact.  No dysarthria, tongue protrusion symmetric.  CNII-XII intact. Normal strength, sensation and reflexes throughout.  No pronator drift, full strength in legs. + R SLR.  Incision well-healed       Data ReviewCBC:  Lab Results  Component Value Date   WBC 6.6 05/13/2024   RBC 4.33 05/13/2024   BMP:  Lab Results  Component Value Date   GLUCOSE 150 (H) 05/13/2024   CO2 23 05/13/2024   BUN 13 05/13/2024   CREATININE 0.93 05/13/2024   CALCIUM  9.7 05/13/2024   Radiology review:   See clinic note for details.  Of note, he has malpositioned pedicle screws at L4 with screw completely medial to the pedicle and traversing through the canal.  There is medial pedicle breach of left L5 screw.  Right L4 pedicle screw traverses through the L3-4 joint.  Assessment:   Active Problems:   * No active hospital problems. *  78 yo M with hx of L4-5 TLIF by Dr. 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  Plan:  - plan for L3-4 DLIF, posterior revision and extension of fusion - Risks, benefits, alternatives, and expected convalescence were discussed with her.  Risks discussed included, but were not limited to bleeding, pain, infection, scar, spinal fluid leak, neurologic deficit, instability, pseudoarthrosis, damage to nearby organs, and death.  Informed consent was obtained.

## 2024-05-20 ENCOUNTER — Inpatient Hospital Stay (HOSPITAL_COMMUNITY)
Admission: RE | Admit: 2024-05-20 | Discharge: 2024-05-21 | DRG: 402 | Disposition: A | Discharge status: Home / Self Care | Attending: Neurosurgery | Admitting: Neurosurgery

## 2024-05-20 ENCOUNTER — Encounter (HOSPITAL_COMMUNITY): Payer: Self-pay

## 2024-05-20 ENCOUNTER — Inpatient Hospital Stay (HOSPITAL_COMMUNITY)
Admission: RE | Disposition: A | Discharge status: Home / Self Care | Payer: Self-pay | Source: Home / Self Care | Attending: Neurosurgery

## 2024-05-20 ENCOUNTER — Inpatient Hospital Stay (HOSPITAL_COMMUNITY)

## 2024-05-20 ENCOUNTER — Other Ambulatory Visit: Payer: Self-pay

## 2024-05-20 ENCOUNTER — Inpatient Hospital Stay (HOSPITAL_COMMUNITY): Payer: Self-pay | Admitting: Vascular Surgery

## 2024-05-20 DIAGNOSIS — Z981 Arthrodesis status: Secondary | ICD-10-CM

## 2024-05-20 DIAGNOSIS — Y838 Other surgical procedures as the cause of abnormal reaction of the patient, or of later complication, without mention of misadventure at the time of the procedure: Secondary | ICD-10-CM | POA: Diagnosis present

## 2024-05-20 DIAGNOSIS — Z96652 Presence of left artificial knee joint: Secondary | ICD-10-CM | POA: Diagnosis present

## 2024-05-20 DIAGNOSIS — Z87891 Personal history of nicotine dependence: Secondary | ICD-10-CM | POA: Diagnosis not present

## 2024-05-20 DIAGNOSIS — Z882 Allergy status to sulfonamides status: Secondary | ICD-10-CM

## 2024-05-20 DIAGNOSIS — Z950 Presence of cardiac pacemaker: Secondary | ICD-10-CM | POA: Diagnosis not present

## 2024-05-20 DIAGNOSIS — F418 Other specified anxiety disorders: Secondary | ICD-10-CM

## 2024-05-20 DIAGNOSIS — I2510 Atherosclerotic heart disease of native coronary artery without angina pectoris: Secondary | ICD-10-CM | POA: Diagnosis present

## 2024-05-20 DIAGNOSIS — T84226A Displacement of internal fixation device of vertebrae, initial encounter: Secondary | ICD-10-CM | POA: Diagnosis present

## 2024-05-20 DIAGNOSIS — M5416 Radiculopathy, lumbar region: Secondary | ICD-10-CM

## 2024-05-20 DIAGNOSIS — I495 Sick sinus syndrome: Secondary | ICD-10-CM | POA: Diagnosis present

## 2024-05-20 DIAGNOSIS — Z87442 Personal history of urinary calculi: Secondary | ICD-10-CM | POA: Diagnosis not present

## 2024-05-20 DIAGNOSIS — I10 Essential (primary) hypertension: Secondary | ICD-10-CM | POA: Diagnosis present

## 2024-05-20 DIAGNOSIS — Z888 Allergy status to other drugs, medicaments and biological substances status: Secondary | ICD-10-CM | POA: Diagnosis not present

## 2024-05-20 DIAGNOSIS — G2581 Restless legs syndrome: Secondary | ICD-10-CM | POA: Diagnosis present

## 2024-05-20 HISTORY — PX: ANTERIOR LAT LUMBAR FUSION: SHX1168

## 2024-05-20 LAB — POCT I-STAT 7, (LYTES, BLD GAS, ICA,H+H)
Acid-base deficit: 2 mmol/L (ref 0.0–2.0)
Bicarbonate: 24.5 mmol/L (ref 20.0–28.0)
Calcium, Ion: 1.26 mmol/L (ref 1.15–1.40)
HCT: 34 % — ABNORMAL LOW (ref 39.0–52.0)
Hemoglobin: 11.6 g/dL — ABNORMAL LOW (ref 13.0–17.0)
O2 Saturation: 100 %
Potassium: 3.9 mmol/L (ref 3.5–5.1)
Sodium: 141 mmol/L (ref 135–145)
TCO2: 26 mmol/L (ref 22–32)
pCO2 arterial: 46.5 mmHg (ref 32–48)
pH, Arterial: 7.33 — ABNORMAL LOW (ref 7.35–7.45)
pO2, Arterial: 253 mmHg — ABNORMAL HIGH (ref 83–108)

## 2024-05-20 SURGERY — ANTERIOR LATERAL LUMBAR FUSION 1 LEVEL
Anesthesia: General | Laterality: Right

## 2024-05-20 MED ORDER — ACETAMINOPHEN 325 MG PO TABS: 650.0000 mg | ORAL_TABLET | ORAL | Status: DC | PRN

## 2024-05-20 MED ORDER — KETOROLAC TROMETHAMINE 15 MG/ML IJ SOLN
7.5000 mg | Freq: Four times a day (QID) | INTRAMUSCULAR | Status: DC
  Administered 2024-05-20 – 2024-05-21 (×3): 7.5 mg via INTRAVENOUS
  Filled 2024-05-20 (×3): qty 1

## 2024-05-20 MED ORDER — BUPIVACAINE HCL (PF) 0.5 % IJ SOLN
INTRAMUSCULAR | Status: DC | PRN
  Administered 2024-05-20: 20 mL

## 2024-05-20 MED ORDER — PREDNISOLONE ACETATE 1 % OP SUSP
1.0000 [drp] | Freq: Four times a day (QID) | OPHTHALMIC | Status: DC
  Administered 2024-05-20: 1 [drp] via OPHTHALMIC
  Filled 2024-05-20: qty 5

## 2024-05-20 MED ORDER — ONDANSETRON HCL 4 MG/2ML IJ SOLN
INTRAMUSCULAR | Status: AC
  Filled 2024-05-20: qty 2

## 2024-05-20 MED ORDER — DROPERIDOL 2.5 MG/ML IJ SOLN: 0.6250 mg | Freq: Once | INTRAMUSCULAR | Status: DC | PRN

## 2024-05-20 MED ORDER — PHENYLEPHRINE 80 MCG/ML (10ML) SYRINGE FOR IV PUSH (FOR BLOOD PRESSURE SUPPORT)
PREFILLED_SYRINGE | INTRAVENOUS | Status: DC | PRN
  Administered 2024-05-20: 120 ug via INTRAVENOUS
  Administered 2024-05-20: 80 ug via INTRAVENOUS
  Administered 2024-05-20: 120 ug via INTRAVENOUS
  Administered 2024-05-20: 40 ug via INTRAVENOUS

## 2024-05-20 MED ORDER — OXYCODONE HCL 5 MG/5ML PO SOLN: 5.0000 mg | Freq: Once | ORAL | Status: DC | PRN

## 2024-05-20 MED ORDER — HYDROMORPHONE HCL 1 MG/ML IJ SOLN
0.2500 mg | INTRAMUSCULAR | Status: DC | PRN
  Administered 2024-05-20: 0.25 mg via INTRAVENOUS

## 2024-05-20 MED ORDER — OXYCODONE HCL 5 MG PO TABS: 5.0000 mg | ORAL_TABLET | Freq: Once | ORAL | Status: DC | PRN

## 2024-05-20 MED ORDER — CEFAZOLIN SODIUM-DEXTROSE 2-4 GM/100ML-% IV SOLN
2.0000 g | Freq: Four times a day (QID) | INTRAVENOUS | Status: AC
  Administered 2024-05-20 (×2): 2 g via INTRAVENOUS
  Filled 2024-05-20 (×2): qty 100

## 2024-05-20 MED ORDER — PROPOFOL 1000 MG/100ML IV EMUL
INTRAVENOUS | Status: AC
  Filled 2024-05-20: qty 100

## 2024-05-20 MED ORDER — HYDROMORPHONE HCL 1 MG/ML IJ SOLN
0.5000 mg | INTRAMUSCULAR | Status: DC | PRN
  Administered 2024-05-20: 0.5 mg via INTRAVENOUS
  Filled 2024-05-20: qty 0.5

## 2024-05-20 MED ORDER — ATORVASTATIN CALCIUM 40 MG PO TABS
40.0000 mg | ORAL_TABLET | Freq: Every day | ORAL | Status: DC
  Administered 2024-05-20: 40 mg via ORAL
  Filled 2024-05-20: qty 1

## 2024-05-20 MED ORDER — PANTOPRAZOLE SODIUM 40 MG PO TBEC
40.0000 mg | DELAYED_RELEASE_TABLET | Freq: Every day | ORAL | Status: DC
  Administered 2024-05-21: 40 mg via ORAL
  Filled 2024-05-20: qty 1

## 2024-05-20 MED ORDER — 0.9 % SODIUM CHLORIDE (POUR BTL) OPTIME
TOPICAL | Status: DC | PRN
  Administered 2024-05-20: 1000 mL

## 2024-05-20 MED ORDER — BUPIVACAINE LIPOSOME 1.3 % IJ SUSP
INTRAMUSCULAR | Status: DC | PRN
  Administered 2024-05-20: 20 mL

## 2024-05-20 MED ORDER — ACETAMINOPHEN 500 MG PO TABS: 1000.0000 mg | ORAL_TABLET | Freq: Once | ORAL | Status: DC

## 2024-05-20 MED ORDER — SODIUM CHLORIDE 0.9% FLUSH: 3.0000 mL | INTRAVENOUS | Status: DC | PRN

## 2024-05-20 MED ORDER — EPHEDRINE SULFATE-NACL 50-0.9 MG/10ML-% IV SOSY
PREFILLED_SYRINGE | INTRAVENOUS | Status: DC | PRN
  Administered 2024-05-20 (×2): 7.5 mg via INTRAVENOUS
  Administered 2024-05-20: 10 mg via INTRAVENOUS

## 2024-05-20 MED ORDER — ONDANSETRON HCL 4 MG/2ML IJ SOLN: 4.0000 mg | Freq: Four times a day (QID) | INTRAMUSCULAR | Status: DC | PRN

## 2024-05-20 MED ORDER — ORAL CARE MOUTH RINSE: 15.0000 mL | Freq: Once | OROMUCOSAL | Status: AC

## 2024-05-20 MED ORDER — PROPOFOL 10 MG/ML IV BOLUS
INTRAVENOUS | Status: AC
Start: 2024-05-20 — End: 2024-05-20
  Filled 2024-05-20: qty 20

## 2024-05-20 MED ORDER — HYDROMORPHONE HCL 1 MG/ML IJ SOLN
INTRAMUSCULAR | Status: AC
  Filled 2024-05-20: qty 1

## 2024-05-20 MED ORDER — CHLORHEXIDINE GLUCONATE 0.12 % MT SOLN
15.0000 mL | Freq: Once | OROMUCOSAL | Status: AC
  Administered 2024-05-20: 15 mL via OROMUCOSAL
  Filled 2024-05-20: qty 15

## 2024-05-20 MED ORDER — SODIUM CHLORIDE 0.9% FLUSH
3.0000 mL | Freq: Two times a day (BID) | INTRAVENOUS | Status: DC
  Administered 2024-05-20: 3 mL via INTRAVENOUS

## 2024-05-20 MED ORDER — CELECOXIB 200 MG PO CAPS
200.0000 mg | ORAL_CAPSULE | Freq: Once | ORAL | Status: DC
Start: 2024-05-20 — End: 2024-05-20

## 2024-05-20 MED ORDER — PROPOFOL 10 MG/ML IV BOLUS
INTRAVENOUS | Status: DC | PRN
  Administered 2024-05-20: 50 mg via INTRAVENOUS
  Administered 2024-05-20: 150 mg via INTRAVENOUS

## 2024-05-20 MED ORDER — POLYETHYLENE GLYCOL 3350 17 G PO PACK: 17.0000 g | PACK | Freq: Every day | ORAL | Status: DC | PRN

## 2024-05-20 MED ORDER — METHOCARBAMOL 500 MG PO TABS
500.0000 mg | ORAL_TABLET | Freq: Four times a day (QID) | ORAL | Status: DC | PRN
  Administered 2024-05-20 – 2024-05-21 (×2): 500 mg via ORAL
  Filled 2024-05-20 (×2): qty 1

## 2024-05-20 MED ORDER — CHLORHEXIDINE GLUCONATE CLOTH 2 % EX PADS: 6.0000 | MEDICATED_PAD | Freq: Once | CUTANEOUS | Status: DC

## 2024-05-20 MED ORDER — LACTATED RINGERS IV SOLN: INTRAVENOUS | Status: DC

## 2024-05-20 MED ORDER — DEXAMETHASONE SODIUM PHOSPHATE 10 MG/ML IJ SOLN
INTRAMUSCULAR | Status: AC
  Filled 2024-05-20: qty 1

## 2024-05-20 MED ORDER — OXYCODONE HCL 5 MG PO TABS
10.0000 mg | ORAL_TABLET | ORAL | Status: DC | PRN
  Administered 2024-05-20 – 2024-05-21 (×3): 10 mg via ORAL
  Filled 2024-05-20 (×3): qty 2

## 2024-05-20 MED ORDER — ACETAMINOPHEN 650 MG RE SUPP: 650.0000 mg | RECTAL | Status: DC | PRN

## 2024-05-20 MED ORDER — EPHEDRINE 5 MG/ML INJ
INTRAVENOUS | Status: AC
  Filled 2024-05-20: qty 5

## 2024-05-20 MED ORDER — FENTANYL CITRATE (PF) 250 MCG/5ML IJ SOLN
INTRAMUSCULAR | Status: DC | PRN
  Administered 2024-05-20: 50 ug via INTRAVENOUS
  Administered 2024-05-20 (×2): 100 ug via INTRAVENOUS

## 2024-05-20 MED ORDER — THROMBIN 5000 UNITS EX KIT
PACK | CUTANEOUS | Status: AC
  Filled 2024-05-20: qty 1

## 2024-05-20 MED ORDER — LIDOCAINE 2% (20 MG/ML) 5 ML SYRINGE
INTRAMUSCULAR | Status: AC
  Filled 2024-05-20: qty 5

## 2024-05-20 MED ORDER — KETOROLAC TROMETHAMINE 0.5 % OP SOLN
1.0000 [drp] | Freq: Four times a day (QID) | OPHTHALMIC | Status: DC
  Administered 2024-05-20: 1 [drp] via OPHTHALMIC
  Filled 2024-05-20: qty 5

## 2024-05-20 MED ORDER — THROMBIN 5000 UNITS EX SOLR
OROMUCOSAL | Status: DC | PRN
  Administered 2024-05-20 (×2): 5 mL via TOPICAL

## 2024-05-20 MED ORDER — LIDOCAINE-EPINEPHRINE 1 %-1:100000 IJ SOLN
INTRAMUSCULAR | Status: AC
  Filled 2024-05-20: qty 1

## 2024-05-20 MED ORDER — DEXAMETHASONE SODIUM PHOSPHATE 10 MG/ML IJ SOLN
INTRAMUSCULAR | Status: DC | PRN
  Administered 2024-05-20: 10 mg via INTRAVENOUS

## 2024-05-20 MED ORDER — METHOCARBAMOL 1000 MG/10ML IJ SOLN: 500.0000 mg | Freq: Four times a day (QID) | INTRAMUSCULAR | Status: DC | PRN

## 2024-05-20 MED ORDER — THROMBIN 20000 UNITS EX SOLR
CUTANEOUS | Status: AC
  Filled 2024-05-20: qty 20000

## 2024-05-20 MED ORDER — MONTELUKAST SODIUM 10 MG PO TABS
10.0000 mg | ORAL_TABLET | Freq: Every day | ORAL | Status: DC
  Administered 2024-05-20: 10 mg via ORAL
  Filled 2024-05-20: qty 1

## 2024-05-20 MED ORDER — MIDAZOLAM HCL 2 MG/2ML IJ SOLN
INTRAMUSCULAR | Status: AC
  Filled 2024-05-20: qty 2

## 2024-05-20 MED ORDER — ONDANSETRON HCL 4 MG/2ML IJ SOLN
INTRAMUSCULAR | Status: DC | PRN
  Administered 2024-05-20: 4 mg via INTRAVENOUS

## 2024-05-20 MED ORDER — LIDOCAINE 2% (20 MG/ML) 5 ML SYRINGE
INTRAMUSCULAR | Status: DC | PRN
  Administered 2024-05-20: 60 mg via INTRAVENOUS

## 2024-05-20 MED ORDER — ALPRAZOLAM 0.5 MG PO TABS: 0.5000 mg | ORAL_TABLET | Freq: Two times a day (BID) | ORAL | Status: DC | PRN

## 2024-05-20 MED ORDER — SODIUM CHLORIDE 0.9 % IV SOLN: 250.0000 mL | INTRAVENOUS | Status: DC

## 2024-05-20 MED ORDER — LOSARTAN POTASSIUM 50 MG PO TABS: 100.0000 mg | ORAL_TABLET | Freq: Every day | ORAL | Status: DC

## 2024-05-20 MED ORDER — THROMBIN 20000 UNITS EX KIT
PACK | CUTANEOUS | Status: DC | PRN
  Administered 2024-05-20: 20 mL via TOPICAL

## 2024-05-20 MED ORDER — SUCCINYLCHOLINE CHLORIDE 200 MG/10ML IV SOSY
PREFILLED_SYRINGE | INTRAVENOUS | Status: AC
  Filled 2024-05-20: qty 10

## 2024-05-20 MED ORDER — PROPOFOL 500 MG/50ML IV EMUL
INTRAVENOUS | Status: DC | PRN
  Administered 2024-05-20: 80 ug/kg/min via INTRAVENOUS

## 2024-05-20 MED ORDER — FLEET ENEMA RE ENEM: 1.0000 | ENEMA | Freq: Once | RECTAL | Status: DC | PRN

## 2024-05-20 MED ORDER — SERTRALINE HCL 50 MG PO TABS
50.0000 mg | ORAL_TABLET | Freq: Every day | ORAL | Status: DC
  Administered 2024-05-20: 50 mg via ORAL
  Filled 2024-05-20: qty 1

## 2024-05-20 MED ORDER — FENTANYL CITRATE (PF) 250 MCG/5ML IJ SOLN
INTRAMUSCULAR | Status: AC
  Filled 2024-05-20: qty 5

## 2024-05-20 MED ORDER — BUPIVACAINE LIPOSOME 1.3 % IJ SUSP
INTRAMUSCULAR | Status: AC
Start: 2024-05-20 — End: 2024-05-20
  Filled 2024-05-20: qty 20

## 2024-05-20 MED ORDER — PHENOL 1.4 % MT LIQD: 1.0000 | OROMUCOSAL | Status: DC | PRN

## 2024-05-20 MED ORDER — INSULIN ASPART 100 UNIT/ML IJ SOLN: 0.0000 [IU] | INTRAMUSCULAR | Status: DC | PRN

## 2024-05-20 MED ORDER — BUPIVACAINE HCL (PF) 0.5 % IJ SOLN
INTRAMUSCULAR | Status: AC
  Filled 2024-05-20: qty 30

## 2024-05-20 MED ORDER — MENTHOL 3 MG MT LOZG: 1.0000 | LOZENGE | OROMUCOSAL | Status: DC | PRN

## 2024-05-20 MED ORDER — ONDANSETRON HCL 4 MG PO TABS: 4.0000 mg | ORAL_TABLET | Freq: Four times a day (QID) | ORAL | Status: DC | PRN

## 2024-05-20 MED ORDER — PHENYLEPHRINE HCL-NACL 20-0.9 MG/250ML-% IV SOLN
INTRAVENOUS | Status: DC | PRN
  Administered 2024-05-20: 40 ug/min via INTRAVENOUS

## 2024-05-20 MED ORDER — PROPYLENE GLYCOL 0.6 % OP SOLN: 1.0000 [drp] | OPHTHALMIC | Status: DC | PRN

## 2024-05-20 MED ORDER — DOCUSATE SODIUM 100 MG PO CAPS
100.0000 mg | ORAL_CAPSULE | Freq: Two times a day (BID) | ORAL | Status: DC
Start: 2024-05-20 — End: 2024-05-21
  Administered 2024-05-20 – 2024-05-21 (×2): 100 mg via ORAL
  Filled 2024-05-20 (×2): qty 1

## 2024-05-20 MED ORDER — CEFAZOLIN SODIUM-DEXTROSE 2-4 GM/100ML-% IV SOLN
2.0000 g | INTRAVENOUS | Status: AC
  Administered 2024-05-20: 2 g via INTRAVENOUS
  Filled 2024-05-20: qty 100

## 2024-05-20 MED ORDER — SUCCINYLCHOLINE CHLORIDE 200 MG/10ML IV SOSY
PREFILLED_SYRINGE | INTRAVENOUS | Status: DC | PRN
Start: 2024-05-20 — End: 2024-05-20
  Administered 2024-05-20: 140 mg via INTRAVENOUS

## 2024-05-20 MED ORDER — OXYCODONE HCL 5 MG PO TABS: 5.0000 mg | ORAL_TABLET | ORAL | Status: DC | PRN

## 2024-05-20 MED ORDER — POLYVINYL ALCOHOL 1.4 % OP SOLN: 1.0000 [drp] | OPHTHALMIC | Status: DC | PRN

## 2024-05-20 MED ORDER — PHENYLEPHRINE 80 MCG/ML (10ML) SYRINGE FOR IV PUSH (FOR BLOOD PRESSURE SUPPORT)
PREFILLED_SYRINGE | INTRAVENOUS | Status: AC
  Filled 2024-05-20: qty 10

## 2024-05-20 MED ORDER — LIDOCAINE-EPINEPHRINE 1 %-1:100000 IJ SOLN
INTRAMUSCULAR | Status: DC | PRN
  Administered 2024-05-20: 6 mL
  Administered 2024-05-20: 5 mL
  Administered 2024-05-20: 9 mL

## 2024-05-20 SURGICAL SUPPLY — 79 items
BAG COUNTER SPONGE SURGICOUNT (BAG) ×1 IMPLANT
BLADE CLIPPER SURG (BLADE) IMPLANT
BUR 14 MATCH 3 (BUR) IMPLANT
BUR MR8 14 BALL 5 (BUR) IMPLANT
BUR PRECISION FLUTE 5.0 (BURR) IMPLANT
BUR SURG IBUR 4X12.5 (BURR) ×1 IMPLANT
CAP PUSHER PTP (ORTHOPEDIC DISPOSABLE SUPPLIES) IMPLANT
CLIP SPRING STIM LLIF SAFEOP (CLIP) IMPLANT
COVERAGE SUPPORT O-ARM STEALTH (MISCELLANEOUS) ×1 IMPLANT
DERMABOND ADVANCED .7 DNX12 (GAUZE/BANDAGES/DRESSINGS) ×1 IMPLANT
DERMABOND ADVANCED .7 DNX6 (GAUZE/BANDAGES/DRESSINGS) IMPLANT
DILATOR INSULATED LLIF 8-13-18 (NEUROSURGERY SUPPLIES) IMPLANT
DISSECTOR BLUNT TIP ENDO 5MM (MISCELLANEOUS) IMPLANT
DRAPE C-ARM 42X72 X-RAY (DRAPES) ×1 IMPLANT
DRAPE C-ARMOR (DRAPES) ×1 IMPLANT
DRAPE LAPAROTOMY 100X72X124 (DRAPES) ×1 IMPLANT
DRAPE SHEET LG 3/4 BI-LAMINATE (DRAPES) ×4 IMPLANT
DRAPE SURG ORHT 6 SPLT 77X108 (DRAPES) IMPLANT
DRAPE UTILITY XL STRL (DRAPES) ×1 IMPLANT
DRSG OPSITE POSTOP 4X6 (GAUZE/BANDAGES/DRESSINGS) IMPLANT
DURAPREP 26ML APPLICATOR (WOUND CARE) ×1 IMPLANT
ELECT COATED BLADE 2.86 ST (ELECTRODE) ×1 IMPLANT
ELECTRODE BLDE INSULATED 6.5IN (ELECTROSURGICAL) ×1 IMPLANT
ELECTRODE KT SAFEOP SSEP/SURF (KITS) IMPLANT
ELECTRODE REM PT RTRN 9FT ADLT (ELECTROSURGICAL) ×1 IMPLANT
EXTENDER BLADE LIF PTP 26 (INSTRUMENTS) IMPLANT
FEE COVERAGE SUPPORT O-ARM (MISCELLANEOUS) ×1 IMPLANT
GAUZE 4X4 16PLY ~~LOC~~+RFID DBL (SPONGE) IMPLANT
GLOVE BIO SURGEON STRL SZ7 (GLOVE) ×4 IMPLANT
GLOVE BIOGEL PI IND STRL 7.5 (GLOVE) ×1 IMPLANT
GLOVE BIOGEL PI IND STRL 8 (GLOVE) ×2 IMPLANT
GLOVE ECLIPSE 7.5 STRL STRAW (GLOVE) IMPLANT
GLOVE ECLIPSE 8.0 STRL XLNG CF (GLOVE) ×1 IMPLANT
GOWN STRL REUS W/ TWL LRG LVL3 (GOWN DISPOSABLE) IMPLANT
GOWN STRL REUS W/ TWL XL LVL3 (GOWN DISPOSABLE) ×3 IMPLANT
GOWN STRL REUS W/TWL 2XL LVL3 (GOWN DISPOSABLE) IMPLANT
GUIDEWIRE LLIF TT 310 (WIRE) IMPLANT
HEMOSTAT POWDER KIT SURGIFOAM (HEMOSTASIS) ×1 IMPLANT
KIT BASIN OR (CUSTOM PROCEDURE TRAY) ×1 IMPLANT
KIT INFUSE X SMALL 1.4CC (Orthopedic Implant) IMPLANT
KIT TURNOVER KIT B (KITS) ×1 IMPLANT
KNIFE ANNULOTOMY SINGLE STL (ORTHOPEDIC DISPOSABLE SUPPLIES) IMPLANT
MARKER SPHERE PSV REFLC NDI (MISCELLANEOUS) ×5 IMPLANT
MATRIX STRIP NEOCORE 12C (Putty) IMPLANT
MODULE POSITIONING LIF 2.0 (INSTRUMENTS) IMPLANT
NDL HYPO 21X1.5 SAFETY (NEEDLE) ×1 IMPLANT
NDL HYPO 22X1.5 SAFETY MO (MISCELLANEOUS) ×1 IMPLANT
NEEDLE HYPO 21X1.5 SAFETY (NEEDLE) ×1 IMPLANT
NEEDLE HYPO 22X1.5 SAFETY MO (MISCELLANEOUS) ×1 IMPLANT
NS IRRIG 1000ML POUR BTL (IV SOLUTION) ×1 IMPLANT
PACK LAMINECTOMY NEURO (CUSTOM PROCEDURE TRAY) ×1 IMPLANT
PATTIES SURGICAL .5 X.5 (GAUZE/BANDAGES/DRESSINGS) ×2 IMPLANT
PATTIES SURGICAL 1X1 (DISPOSABLE) ×2 IMPLANT
PIN BONE FIX 100 (PIN) IMPLANT
PROBE BALL TIP LLIF SAFEOP (NEUROSURGERY SUPPLIES) IMPLANT
ROD LORD MIS TI 5.5X40 (Rod) IMPLANT
ROD TI MIS LORDOTIC V12 5.5X45 (Rod) IMPLANT
SCREW CANN 55X6.5 (Screw) IMPLANT
SCREW CANN FENS 7.5X55 (Screw) IMPLANT
SET SCREW SPNE (Screw) IMPLANT
SHIM INTRADISCAL PTP WIDE (ORTHOPEDIC DISPOSABLE SUPPLIES) IMPLANT
SPACER IDENTITI 8X18X55 10D (Spacer) IMPLANT
SPIKE FLUID TRANSFER (MISCELLANEOUS) ×1 IMPLANT
SPONGE NEURO XRAY DETECT 1X3 (DISPOSABLE) IMPLANT
SPONGE SURGIFOAM ABS GEL 100 (HEMOSTASIS) IMPLANT
SPONGE T-LAP 4X18 ~~LOC~~+RFID (SPONGE) IMPLANT
SPONGE TONSIL 1 RF SGL (DISPOSABLE) IMPLANT
STAPLER SKIN PROX 35W (STAPLE) ×1 IMPLANT
SUT MNCRL AB 4-0 PS2 18 (SUTURE) ×1 IMPLANT
SUT VIC AB 0 CT1 18XCR BRD8 (SUTURE) ×1 IMPLANT
SUT VIC AB 2-0 CP2 18 (SUTURE) ×1 IMPLANT
SUT VIC AB 3-0 SH 8-18 (SUTURE) IMPLANT
SYR 20ML LL LF (SYRINGE) ×1 IMPLANT
SYR 30ML SLIP (SYRINGE) ×2 IMPLANT
SYS LTP ILLUMINATOR SIGMA ATEC (ORTHOPEDIC DISPOSABLE SUPPLIES) IMPLANT
TOWEL GREEN STERILE (TOWEL DISPOSABLE) ×1 IMPLANT
TOWEL GREEN STERILE FF (TOWEL DISPOSABLE) ×1 IMPLANT
TRAY FOLEY MTR SLVR 16FR STAT (SET/KITS/TRAYS/PACK) ×1 IMPLANT
WATER STERILE IRR 1000ML POUR (IV SOLUTION) ×1 IMPLANT

## 2024-05-20 NOTE — Transfer of Care (Signed)
 Immediate Anesthesia Transfer of Care Note  Patient: Jesus Blevins  Procedure(s) Performed: ANTERIOR LATERAL LUMBAR FUSION 1 LEVEL (Right) APPLICATION OF O-ARM (Right)  Patient Location: PACU  Anesthesia Type:General  Level of Consciousness: awake and alert   Airway & Oxygen Therapy: Patient Spontanous Breathing and Patient connected to face mask oxygen  Post-op Assessment: Report given to RN and Post -op Vital signs reviewed and stable  Post vital signs: Reviewed and stable  Last Vitals:  Vitals Value Taken Time  BP 124/68 05/20/24 13:00  Temp    Pulse 80 05/20/24 13:01  Resp 11 05/20/24 13:01  SpO2 98 % 05/20/24 13:01  Vitals shown include unfiled device data.  Last Pain:  Vitals:   05/20/24 0630  TempSrc:   PainSc: 2          Complications: No notable events documented.

## 2024-05-20 NOTE — Anesthesia Procedure Notes (Signed)
 Arterial Line Insertion Start/End6/11/2024 7:13 AM, 05/20/2024 7:20 AM Performed by: Micheal Agent, DO, Candance Certain, CRNA, CRNA  Patient location: Pre-op. Preanesthetic checklist: patient identified, IV checked, site marked, risks and benefits discussed, surgical consent, monitors and equipment checked, pre-op evaluation, timeout performed and anesthesia consent Lidocaine  1% used for infiltration Left, radial was placed Catheter size: 20 G Hand hygiene performed  and Seldinger technique used  Attempts: 1 Procedure performed using ultrasound guided technique. Ultrasound Notes:anatomy identified, needle tip was noted to be adjacent to the nerve/plexus identified and no ultrasound evidence of intravascular and/or intraneural injection Following insertion, dressing applied. Post procedure assessment: normal

## 2024-05-20 NOTE — Anesthesia Postprocedure Evaluation (Signed)
 Anesthesia Post Note  Patient: Jesus Blevins  Procedure(s) Performed: RIGHT DIRECT LATERAL INTERBODY LUMBAR FUSION SCREW FIXATION  LUMBAR THREE-FOUR PRONE TRANSPSOAS, WITH REMOVAL OLD HARDWARE (Right) APPLICATION OF O-ARM (Right)     Patient location during evaluation: PACU Anesthesia Type: General Level of consciousness: awake and alert Pain management: pain level controlled Vital Signs Assessment: post-procedure vital signs reviewed and stable Respiratory status: spontaneous breathing, nonlabored ventilation, respiratory function stable and patient connected to nasal cannula oxygen Cardiovascular status: blood pressure returned to baseline and stable Postop Assessment: no apparent nausea or vomiting Anesthetic complications: no   No notable events documented.  Last Vitals:  Vitals:   05/20/24 1430 05/20/24 1514  BP:  117/61  Pulse: 62 64  Resp: 13 18  Temp: 36.6 C   SpO2: 92% 94%    Last Pain:  Vitals:   05/20/24 1600  TempSrc:   PainSc: 8                  Kaedynce Tapp P Jakara Blatter

## 2024-05-20 NOTE — Anesthesia Procedure Notes (Signed)
 Procedure Name: Intubation Date/Time: 05/20/2024 8:23 AM  Performed by: Candance Certain, CRNAPre-anesthesia Checklist: Patient identified, Emergency Drugs available, Suction available and Patient being monitored Patient Re-evaluated:Patient Re-evaluated prior to induction Oxygen Delivery Method: Circle System Utilized Preoxygenation: Pre-oxygenation with 100% oxygen Induction Type: IV induction Ventilation: Mask ventilation without difficulty Laryngoscope Size: Mac and 4 Grade View: Grade II Tube type: Oral Tube size: 7.5 mm Number of attempts: 1 Airway Equipment and Method: Stylet and Oral airway Placement Confirmation: ETT inserted through vocal cords under direct vision, positive ETCO2 and breath sounds checked- equal and bilateral Secured at: 23 cm Tube secured with: Tape Dental Injury: Teeth and Oropharynx as per pre-operative assessment and Injury to lip  Comments: Easy mask ventilation without OPA. DL x 1 by CRNA with grade 2 view, but lost view when reaching for ETT. DL x2 by CRNA and able to get back to grade 2 view and easily pass ETT through glottis. Small cut to R lower lip/corner lip. Phenylephrine  soaked gauze applied. SBB placed by CRNA

## 2024-05-20 NOTE — Op Note (Signed)
 Procedure(s): ANTERIOR LATERAL LUMBAR FUSION 1 LEVEL APPLICATION OF O-ARM Procedure Note  Jesus Blevins male 78 y.o. 05/20/2024  Procedure(s) and Anesthesia Type:    * ANTERIOR LATERAL LUMBAR FUSION 1 LEVEL - General    * APPLICATION OF O-ARM - General  Surgeons and Role:    Andy Bannister, Cleatrice Curl, MD - Primary   Indications: 78 y.o. male with hx of R L4-5 TLIF 08/2018 by Dr. Ali Antonio who developed progressive right sided radicular pain and back pain.  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   Nonsurgical measures failed to improve his symptoms adequately so he decided to proceed with surgery with L3-4 DLIF, posterior revision and extension of fusion. Risks, benefits, alternatives, and expected convalescence were discussed with her.  Risks discussed included, but were not limited to bleeding, pain, infection, scar, spinal fluid leak, neurologic deficit, instability, pseudoarthrosis, damage to nearby organs, and death.  Informed consent was obtained.     Surgeon: Van Gelinas   Assistants: Easter Golden, PA.  Please note no qualified trainees were available to assist with the procedure.  Assistance was required through the entirety of the procedure.   Anesthesia: General endotracheal anesthesia   Procedure Detail  L3-4 anterior lumbar interbody fusion via right lateral retroperitoneal transpsoas approach in prone position L3-4 posterior arthrodesis Placement of interbody cage L3-4 Removal of previous L4-5 posterior instrumentation Nonsegmental instrumentation with percutaneous pedicle screw/rod construct  The patient was brought to the operative room.  General anesthesia was induced and patient was intubated by the anesthesia service.  After appropriate lines and monitors were placed, patient was positioned prone on open [place] table.  All pressure points padded and the eyes were protected.  The patient and the  bed was positioned for a true lateral and AP C-arm x-rays at each level.  The flank and lower back was preprepped with alcohol and prepped and draped in sterile fashion.  A timeout was performed.  1% at lidocaine  with epinephrine  was injected in the planned incision.  Using the C-arm x-ray, a oblique incision was planned over the right flank at the L3-4 disc space.  Incision was made with a 10 blade and the fascia was opened.  The external oblique, internal oblique, and transversalis muscle and transversalis fascia were opened bluntly with spreading instruments.  The retroperitoneal space was dissected and the peritoneal contents were reflected anteriorly.  The quadratus lumborum, transverse process and finally the psoas muscle was dissected out.  A initial dilator was placed on the lateral surface of the psoas muscle over the L3-4 disc space as confirmed by x-ray.  The dilator was then passed through the psoas muscle and docked onto the disc at the midpoint of the body due to proximity of the nerves more posteriorly. Stimulation showed there was no proximity of any lumbar plexus nerves in this region.  The dilator was secured in place with a K wire and subsequent dilators were passed and stimulated and again yielded no neural activity nearby.  Retractor was then passed onto the disc space and secured in place with a bed attachment.  The dilators were removed and the field on the posterior blade were directly stimulated.  There was no visual evidence or electromyographic evidence of nerves in the field.  The retractor was then opened and again the field was stimulated with no evidence of nerves in the field.  Annulotomy of the collapsed disc space was performed  with a retractable blade and discectomy was performed with pituitary rongeurs and box cutters.  The contralateral annulus was released with Cobb and the disc space was prepared with curettes and rasps.  Trial implants were placed and with the aid of  fluoroscopy with AP and lateral projections, a size 8 mm lordotic graft was placed. The retractor was then removed.    There were no changes in the femoral nerve SSEPs throughout the case.  Final x-rays showed good partial reduction of retrolisthesis, adequate restoration of disc space height and foraminal height.  The retroperitoneal space was then irrigated and examined with no evidence of any bleeding.  The fascia was closed with 0 Vicryl stitches.  The dermal layer was closed with 2-0 Vicryl stitches.  Skin was closed with 4-0 Monocryl subcuticular manner followed by Dermabond.  Under fluoroscopy, the previous pedicle screws of L4 and L5 were marked on the skin.  Bilateral paramedian incisions were performed and Wiltse approach through the fascia and between muscle fibers made to the previous pedicle screws.  However, an additional medial incision through the fascia had to be performed for the left L4 pedicle screw which had a entry point at the medial facet.  Screw caps were removed, rods removed and pedicle screws removed.  No CSF egress was seen from the left L4 screw hole which was filled with floseal, gelfoam and capped with small amount of bone wax.    Small stab incision was made and PSIS pin was placed, and intraoperative CT obtained to allow for neuronavigation.  Using the Stealth navigation, previously made paramedian incisions were extended superiorly for L3 screws.  Navigated high speed drill was used to cannulate the L3 pedicles, and left L4 and pedicles were then tapped using navigation.  Ball ended feeler confirmed good cannulation with no breaches.  Previous right L4 screw tract which was traversing through the L3-4 facet was used to place a new L4 pedicle screw which had excellent purchase.  Rods were then passed through the screw towers bilaterally and secured with screw caps.  AP and lateral X-ray confirmed good placement of instrumentation.  The screw caps were final tightened and the  towers removed.  Navigated drill was used to decorticate the L3-4 facet joints bilaterally and remaining bone graft was placed in the facet and lateral gutter.  Wounds were irrigated thoroughly .  Exparel  mixed with Marcaine  was injected into the paraspinous muscles and subcutaneous tissues bilaterally.  The fascia was closed with 0 Vicryl stitches.  The dermal layer was closed with 2-0 Vicryl stitches in buried interrupted fashion.  The skin incisions were closed with 4-0 Monocryl subcuticular manner followed by Dermabond.  The PSIS pin was removed and small incision closed with 3-0 vicryl in buried fashion and Dermabond.  Sterile dressings were placed.  Patient was then flipped supine and extubated by the anesthesia service following commands and all 4 extremities.  All counts were correct at the end of surgery.  No complications were noted.     Findings: Successful L3-4 height restoration, interbody fusion  Estimated Blood Loss:  less than 50 mL         Drains: none         Total IV Fluids: see anesthesia records  Blood Given: none          Specimens: none         Implants:   AlphaTec  8PH/11AH mm x 18 x 55 mm IdentiTi cage 6.5 x 55 mm screws at  L3 bilaterally, left L4 7.5 x 55 mm screw at right L4 NeoCore, BMP        Complications:  * No complications entered in OR log *         Disposition: PACU - hemodynamically stable.         Condition: stable

## 2024-05-21 MED ORDER — OXYCODONE HCL 10 MG PO TABS: 10.0000 mg | ORAL_TABLET | ORAL | 0 refills | Status: AC | PRN

## 2024-05-21 MED ORDER — DOCUSATE SODIUM 100 MG PO CAPS: 100.0000 mg | ORAL_CAPSULE | Freq: Two times a day (BID) | ORAL | 0 refills | Status: AC

## 2024-05-21 MED ORDER — METHOCARBAMOL 500 MG PO TABS: 500.0000 mg | ORAL_TABLET | Freq: Four times a day (QID) | ORAL | 2 refills | Status: AC | PRN

## 2024-05-21 MED FILL — Thrombin For Soln 5000 Unit: CUTANEOUS | Qty: 5000 | Status: AC

## 2024-05-21 MED FILL — Thrombin For Soln 20000 Unit: CUTANEOUS | Qty: 1 | Status: AC

## 2024-05-21 NOTE — Discharge Instructions (Signed)
 Wound Care Keep incision covered and dry until post op day 3. You may remove the Honeycomb dressing on post op day 3. Leave steri-strips on back.  They will fall off by themselves. Do not put any creams, lotions, or ointments on incision. You are fine to shower. Let water run over incision and pat dry.   Activity Walk each and every day, increasing distance each day. No lifting greater than 8 lbs.  No lifting no bending no twisting no driving , you can ride as a passenger locally If provided with back brace, wear when out of bed.  It is not necessary to wear brace in bed.  Diet Resume your normal diet.    Call Your Doctor If Any of These Occur Redness, drainage, or swelling at the wound.  Temperature greater than 101 degrees. Severe pain not relieved by pain medication. Incision starts to come apart.  Follow Up Appt Call [phone] if you have one or any problem.

## 2024-05-21 NOTE — Evaluation (Signed)
 Physical Therapy Evaluation  Patient Details Name: Jesus Blevins MRN: [id number] DOB: 08-01-1946 Today's Date: 05/21/2024  History of Present Illness  Jesus Blevins is a 78 yo male who presents s/p  L3-4 ALIF, posterior revision and extension of fusion. PMH significant for prior back surgery, anxiety, CAD, depression, hypercalcemia, HTN, IBS, OA, pacemaker  Clinical Impression  Pt admitted with above diagnosis. At the time of PT eval, pt was able to demonstrate transfers and ambulation with gross CGA and RW for support. Pt was educated on precautions, brace application/wearing schedule, appropriate activity progression, and car transfer. Pt currently with functional limitations due to the deficits listed below (see PT Problem List). Pt will benefit from skilled PT to increase their independence and safety with mobility to allow discharge to the venue listed below.          If plan is discharge home, recommend the following: A little help with walking and/or transfers;A little help with bathing/dressing/bathroom;Assistance with cooking/housework;Assist for transportation;Help with stairs or ramp for entrance   Can travel by private vehicle        Equipment Recommendations Rolling walker (2 wheels)  Recommendations for Other Services       Functional Status Assessment Patient has had a recent decline in their functional status and demonstrates the ability to make significant improvements in function in a reasonable and predictable amount of time.     Precautions / Restrictions Precautions Precautions: Fall;Back Precaution Booklet Issued: Yes (comment) Recall of Precautions/Restrictions: Intact Precaution/Restrictions Comments: Reviewed handout and pt was cued for precautions during functional mobility. Required Braces or Orthoses: Spinal Brace Spinal Brace:  (No brace needed order) Restrictions Weight Bearing Restrictions Per Provider Order: No      Mobility  Bed Mobility Overal  bed mobility: Needs Assistance Bed Mobility: Rolling, Sidelying to Sit Rolling: Supervision Sidelying to sit: Supervision            Transfers Overall transfer level: Needs assistance Equipment used: Rolling walker (2 wheels) Transfers: Sit to/from Stand Sit to Stand: Contact guard assist           General transfer comment: VC's for wide BOS and hand placement on seated surface for safety. No assist required, however hands on guaridng provided for safety.    Ambulation/Gait Ambulation/Gait assistance: Contact guard assist Gait Distance (Feet): 300 Feet Assistive device: Rolling walker (2 wheels) Gait Pattern/deviations: Step-through pattern, Decreased stride length, Trunk flexed Gait velocity: Decreased Gait velocity interpretation: 1.31 - 2.62 ft/sec, indicative of limited community ambulator   General Gait Details: VC's for improved posture, closer walker proximity and forward gaze. Pt rushing gait cycle and leaning forward push the walker out in front of him. With cues, able to correct. No assist required but hands on guarding provided for safety.  Stairs Stairs: Yes Stairs assistance: Contact guard assist Stair Management: Two rails, Step to pattern, Forwards Number of Stairs: 5 General stair comments: VC's for sequencing and general safety.  Wheelchair Mobility     Tilt Bed    Modified Rankin (Stroke Patients Only)       Balance Overall balance assessment: Needs assistance Sitting-balance support: Feet supported Sitting balance-Leahy Scale: Good     Standing balance support: No upper extremity supported, During functional activity Standing balance-Leahy Scale: Fair Standing balance comment: statically                             Pertinent Vitals/Pain Pain Assessment Pain Assessment: Faces Faces  Pain Scale: Hurts little more Pain Location: back Pain Descriptors / Indicators: Discomfort Pain Intervention(s): Limited activity within  patient's tolerance, Monitored during session, Repositioned    Home Living Family/patient expects to be discharged to:: Private residence Living Arrangements: Alone Available Help at Discharge: Family;Available PRN/intermittently (very limited assist on weekdays) Type of Home: Mobile home Home Access: Stairs to enter Entrance Stairs-Rails: Right;Left Entrance Stairs-Number of Steps: 5   Home Layout: One level Home Equipment: Cane - single point;Shower seat;Adaptive equipment Additional Comments: pt will stay at daughters house for the first few days, her home set up is similar but has a level entry    Prior Function Prior Level of Function : Driving;Independent/Modified Independent             Mobility Comments: intermittent use of SPC ADLs Comments: indep, drives     Extremity/Trunk Assessment   Upper Extremity Assessment Upper Extremity Assessment: Defer to OT evaluation    Lower Extremity Assessment Lower Extremity Assessment: Generalized weakness;RLE deficits/detail RLE Deficits / Details: Mild diminished sensation in tops of toes. Pt reports overall has been improving as numbness was throughout feet initially after surgery.    Cervical / Trunk Assessment Cervical / Trunk Assessment: Back Surgery  Communication   Communication Communication: No apparent difficulties    Cognition Arousal: Alert Behavior During Therapy: WFL for tasks assessed/performed   PT - Cognitive impairments: No apparent impairments                         Following commands: Intact       Cueing Cueing Techniques: Verbal cues     General Comments General comments (skin integrity, edema, etc.): VSS    Exercises     Assessment/Plan    PT Assessment Patient needs continued PT services  PT Problem List Decreased strength;Decreased balance;Decreased knowledge of use of DME;Decreased safety awareness;Decreased knowledge of precautions;Pain       PT Treatment  Interventions DME instruction;Gait training;Stair training;Functional mobility training;Therapeutic activities;Therapeutic exercise;Balance training;Patient/family education    PT Goals (Current goals can be found in the Care Plan section)  Acute Rehab PT Goals Patient Stated Goal: Home today, be able to go back to his mobile home soon PT Goal Formulation: With patient/family Time For Goal Achievement: 05/28/24 Potential to Achieve Goals: Good    Frequency Min 5X/week     Co-evaluation               AM-PAC PT 6 Clicks Mobility  Outcome Measure Help needed turning from your back to your side while in a flat bed without using bedrails?: A Little Help needed moving from lying on your back to sitting on the side of a flat bed without using bedrails?: A Little Help needed moving to and from a bed to a chair (including a wheelchair)?: A Little Help needed standing up from a chair using your arms (e.g., wheelchair or bedside chair)?: A Little Help needed to walk in hospital room?: A Little Help needed climbing 3-5 steps with a railing? : A Little 6 Click Score: 18    End of Session Equipment Utilized During Treatment: Gait belt Activity Tolerance: Patient tolerated treatment well Patient left: in bed;with call bell/phone within reach;with family/visitor present Nurse Communication: Mobility status PT Visit Diagnosis: Unsteadiness on feet (R26.81);Pain Pain - part of body:  (back)    Time: 1030-1049 PT Time Calculation (min) (ACUTE ONLY): 19 min   Charges:   PT Evaluation $PT Eval Low Complexity: 1  Low   PT General Charges $$ ACUTE PT VISIT: 1 Visit         Simone Dubois, PT, DPT Acute Rehabilitation Services Secure Chat Preferred Office: [phone]   Venus Ginsberg 05/21/2024, 11:45 AM

## 2024-05-21 NOTE — Discharge Summary (Signed)
 Patient ID: Jesus Blevins MRN: [id number] DOB/AGE: 12-25-1945 78 y.o.  Admit date: 05/20/2024 Discharge date: 05/21/2024  Admission Diagnoses: Lumbar radiculopathy [M54.16]   Discharge Diagnoses: Same   Discharged Condition: Stable  Hospital Course:  Jesus Blevins is a 78 y.o. male who was admitted following an uncomplicated L3-4 DLIF, extension PSF. They were recovered in PACU and transferred to the floor. Hospital course was uncomplicated. Pt stable for discharge today. Pt to f/u in office for routine post op visit. Pt is in agreement w/ plan.    Discharge Exam: Blood pressure (!) 110/57, pulse 60, temperature 98.5 F (36.9 C), temperature source Oral, resp. rate 20, height 5' 10 (1.778 m), weight 95.3 kg, SpO2 96%. A&O Speech fluent, appropriate Strength grossly intact BUE/BLE.  SILTx4.  Dressings c/d/I.   Disposition: Discharge disposition: 01-Home or Self Care       Discharge Instructions     Incentive spirometry RT   Complete by: As directed    No wound care   Complete by: As directed       Allergies as of 05/21/2024       Reactions   Elavil [amitriptyline] Anxiety   Sulfa Antibiotics Rash   Childhood allergy        Medication List     TAKE these medications    ALPRAZolam  0.5 MG tablet Commonly known as: XANAX  Take 0.5 mg 2 (two) times daily as needed by mouth for anxiety (takes 1 dose every morning and another dose only if needed).   AMINO ACID PO Take 1 tablet by mouth daily.   Astepro  205.5 MCG/SPRAY Soln Generic drug: Azelastine  HCl Place 1 spray into the nose at bedtime.   atorvastatin  40 MG tablet Commonly known as: LIPITOR Take 40 mg by mouth at bedtime.   B-complex with vitamin C tablet Take 1 tablet by mouth daily.   Calcium -Magnesium -Zinc-D3 Tabs Take 1 tablet by mouth at bedtime.   cetirizine 10 MG tablet Commonly known as: ZYRTEC Take 10 mg by mouth daily.   docusate sodium  100 MG capsule Commonly known as:  COLACE Take 1 capsule (100 mg total) by mouth 2 (two) times daily.   FISH OIL-FLAX OIL-BORAGE OIL PO Take 1 capsule by mouth 2 (two) times daily. 800 mg fish oil  800 mg flaxseed oil 400 mg Borage oil   fluticasone  50 MCG/ACT nasal spray Commonly known as: FLONASE  Place 2 sprays daily into both nostrils.   Glucosamine Chondr 1500 Complx Caps Take 1 capsule by mouth 2 (two) times daily. Cosamine ASU   ketorolac  0.5 % ophthalmic solution Commonly known as: ACULAR  Place 1 drop into the left eye 4 (four) times daily.   losartan  100 MG tablet Commonly known as: COZAAR  Take 100 mg by mouth daily.   methocarbamol  500 MG tablet Commonly known as: ROBAXIN  Take 1 tablet (500 mg total) by mouth every 6 (six) hours as needed for muscle spasms.   montelukast  10 MG tablet Commonly known as: SINGULAIR  Take 10 mg by mouth at bedtime.   multivitamin with minerals Tabs tablet Take 0.5 tablets by mouth 2 (two) times daily.   omeprazole 20 MG capsule Commonly known as: PRILOSEC Take 20 mg by mouth daily as needed (when traveling).   Oxycodone  HCl 10 MG Tabs Take 1 tablet (10 mg total) by mouth every 4 (four) hours as needed for severe pain (pain score 7-10).   prednisoLONE  acetate 1 % ophthalmic suspension Commonly known as: PRED FORTE  Place 1 drop into the left eye  4 (four) times daily.   sertraline  50 MG tablet Commonly known as: ZOLOFT  Take 50 mg by mouth at bedtime.   Systane Balance 0.6 % Soln Generic drug: Propylene Glycol Place 1 drop into both eyes as needed (dry eyes).   VISION VITAMINS PO Take 1 tablet by mouth in the morning and at bedtime. Vision Guard   Vitamin D  50 MCG (2000 UT) tablet Take 2,000 Units by mouth daily.         Signed: Celena Lanius CAYLIN Labella Zahradnik 05/21/2024, 9:43 AM

## 2024-05-21 NOTE — Plan of Care (Signed)

## 2024-05-21 NOTE — Progress Notes (Signed)

## 2024-05-21 NOTE — Evaluation (Signed)
 Occupational Therapy Evaluation Patient Details Name: LANCER THURNER MRN: [id number] DOB: 01/12/1946 Today's Date: 05/21/2024   History of Present Illness   BENTLIE CATANZARO is a 78 yo male who is s/p  L3-4 DLIF, posterior revision and extension of fusion. PMHx: back surgery, anxiety, CAD, depression, GERD, hypercalcemia, HTN, IBS, OA, pacemaker     Clinical Impressions Italo was evaluated s/p the above spine surgery. He lives alone and indep at baseline, pt plans to stay at daughters house for increased assist through the weekend. Upon evaluation pt was limited by surgical pain, bilat foot numbness (new), spinal precautions, unsteady balance, decreased activity tolerance and generalized weakness. Overall he was able to complete bed mobility, transfers and functional mobility with up to CGA and RW for safety. He also required min A for LB ADLs to maintain spinal precautions, pt has AE at home to increase indep with task. Provided cues and education on spinal precautions and compensatory techniques throughout, handout provided and pt demonstrated good recall during ADLs and mobility. Pt does not require further acute OT services. Recommend d/c home with support of family.       If plan is discharge home, recommend the following:   A little help with bathing/dressing/bathroom;Assistance with cooking/housework;Assist for transportation     Functional Status Assessment   Patient has had a recent decline in their functional status and demonstrates the ability to make significant improvements in function in a reasonable and predictable amount of time.     Equipment Recommendations   Other (comment) (RW)      Precautions/Restrictions   Precautions Precautions: Fall;Back Recall of Precautions/Restrictions: Intact Precaution/Restrictions Comments: no brace needed Restrictions Weight Bearing Restrictions Per Provider Order: No     Mobility Bed Mobility Overal bed mobility: Needs  Assistance Bed Mobility: Rolling, Sidelying to Sit Rolling: Supervision Sidelying to sit: Supervision            Transfers Overall transfer level: Needs assistance Equipment used: Rolling walker (2 wheels) Transfers: Sit to/from Stand Sit to Stand: Contact guard assist                  Balance Overall balance assessment: Needs assistance Sitting-balance support: Feet supported Sitting balance-Leahy Scale: Good     Standing balance support: No upper extremity supported, During functional activity Standing balance-Leahy Scale: Fair Standing balance comment: statically                           ADL either performed or assessed with clinical judgement   ADL Overall ADL's : Needs assistance/impaired Eating/Feeding: Independent   Grooming: Supervision/safety;Standing   Upper Body Bathing: Set up;Sitting   Lower Body Bathing: Minimal assistance;Sit to/from stand   Upper Body Dressing : Set up;Sitting   Lower Body Dressing: Minimal assistance;Sit to/from stand   Toilet Transfer: Supervision/safety;Rolling walker (2 wheels);Regular Toilet   Toileting- Clothing Manipulation and Hygiene: Modified independent       Functional mobility during ADLs: Supervision/safety;Rolling walker (2 wheels) General ADL Comments: Min A for LB ADLs to maintain spinal precautions. pt has AE at home to increase indep with task     Vision Baseline Vision/History: 1 Wears glasses Vision Assessment?: No apparent visual deficits     Perception Perception: Within Functional Limits       Praxis Praxis: WFL       Pertinent Vitals/Pain Pain Assessment Pain Assessment: Faces Faces Pain Scale: Hurts little more Pain Location: back Pain Descriptors / Indicators: Discomfort Pain  Intervention(s): Limited activity within patient's tolerance, Monitored during session     Extremity/Trunk Assessment Upper Extremity Assessment Upper Extremity Assessment: Overall WFL for  tasks assessed   Lower Extremity Assessment Lower Extremity Assessment: Defer to PT evaluation   Cervical / Trunk Assessment Cervical / Trunk Assessment: Back Surgery   Communication Communication Communication: No apparent difficulties   Cognition Arousal: Alert Behavior During Therapy: WFL for tasks assessed/performed Cognition: No apparent impairments                               Following commands: Intact       Cueing  General Comments   Cueing Techniques: Verbal cues  VSS   Exercises     Shoulder Instructions      Home Living Family/patient expects to be discharged to:: Private residence Living Arrangements: Alone Available Help at Discharge: Family;Available PRN/intermittently (very limited assist on weekdays) Type of Home: Mobile home Home Access: Stairs to enter Entrance Stairs-Number of Steps: 5 Entrance Stairs-Rails: Right;Left Home Layout: One level     Bathroom Shower/Tub: Producer, television/film/video: Standard Bathroom Accessibility: No   Home Equipment: Cane - single point;Shower seat;Adaptive equipment Adaptive Equipment: Reacher Additional Comments: pt will stay at daughters house for the first few days, her home set up is similar but has a level entry      Prior Functioning/Environment Prior Level of Function : Driving;Independent/Modified Independent             Mobility Comments: intermittent use of SPC ADLs Comments: indep, drives    OT Problem List: Decreased activity tolerance;Impaired balance (sitting and/or standing);Decreased safety awareness;Decreased knowledge of use of DME or AE;Decreased knowledge of precautions        OT Goals(Current goals can be found in the care plan section)   Acute Rehab OT Goals Patient Stated Goal: home OT Goal Formulation: With patient Time For Goal Achievement: 06/04/24 Potential to Achieve Goals: Good   AM-PAC OT 6 Clicks Daily Activity     Outcome Measure Help  from another person eating meals?: None Help from another person taking care of personal grooming?: A Little Help from another person toileting, which includes using toliet, bedpan, or urinal?: A Little Help from another person bathing (including washing, rinsing, drying)?: A Little Help from another person to put on and taking off regular upper body clothing?: A Little Help from another person to put on and taking off regular lower body clothing?: A Little 6 Click Score: 19   End of Session Equipment Utilized During Treatment: Gait belt;Rolling walker (2 wheels) Nurse Communication: Mobility status  Activity Tolerance: Patient tolerated treatment well Patient left: in bed;with call bell/phone within reach;with family/visitor present  OT Visit Diagnosis: Unsteadiness on feet (R26.81);Muscle weakness (generalized) (M62.81);Pain                Time: 0272-5366 OT Time Calculation (min): 28 min Charges:  OT General Charges $OT Visit: 1 Visit OT Evaluation $OT Eval Moderate Complexity: 1 Mod OT Treatments $Self Care/Home Management : 8-22 mins  Veryl Gottron, OTR/L Acute Rehabilitation Services Office [phone] Secure Chat Communication Preferred   Starla Easterly 05/21/2024, 11:08 AM

## 2024-05-24 ENCOUNTER — Encounter (HOSPITAL_COMMUNITY): Payer: Self-pay | Admitting: Neurosurgery
